# Patient Record
Sex: Male | Born: 1935 | Race: White | Hispanic: No | Smoking: Former smoker
Health system: Southern US, Community
[De-identification: ages and names within clinical notes are randomized; demographics above are authoritative.]

## PROBLEM LIST (undated history)

## (undated) DIAGNOSIS — N400 Enlarged prostate without lower urinary tract symptoms: Secondary | ICD-10-CM

## (undated) DIAGNOSIS — E785 Hyperlipidemia, unspecified: Secondary | ICD-10-CM

## (undated) DIAGNOSIS — I10 Essential (primary) hypertension: Secondary | ICD-10-CM

## (undated) HISTORY — PX: TIBIA FRACTURE SURGERY: SHX806

## (undated) HISTORY — DX: Benign prostatic hyperplasia without lower urinary tract symptoms: N40.0

## (undated) HISTORY — DX: Essential (primary) hypertension: I10

## (undated) HISTORY — PX: NO PAST SURGERIES: SHX2092

## (undated) HISTORY — DX: Hyperlipidemia, unspecified: E78.5

---

## 2005-01-02 ENCOUNTER — Ambulatory Visit: Payer: Self-pay | Admitting: Internal Medicine

## 2005-01-31 ENCOUNTER — Ambulatory Visit: Payer: Self-pay | Admitting: Internal Medicine

## 2005-05-28 ENCOUNTER — Ambulatory Visit: Payer: Self-pay | Admitting: Internal Medicine

## 2005-07-12 ENCOUNTER — Ambulatory Visit: Payer: Self-pay | Admitting: Internal Medicine

## 2005-07-16 ENCOUNTER — Ambulatory Visit: Payer: Self-pay | Admitting: Internal Medicine

## 2005-07-30 ENCOUNTER — Ambulatory Visit: Payer: Self-pay | Admitting: Internal Medicine

## 2005-08-02 ENCOUNTER — Ambulatory Visit: Payer: Self-pay | Admitting: Internal Medicine

## 2005-10-11 ENCOUNTER — Ambulatory Visit: Payer: Self-pay | Admitting: Internal Medicine

## 2005-10-17 ENCOUNTER — Ambulatory Visit: Payer: Self-pay | Admitting: Internal Medicine

## 2005-10-30 ENCOUNTER — Ambulatory Visit: Payer: Self-pay | Admitting: Internal Medicine

## 2005-12-18 ENCOUNTER — Ambulatory Visit: Payer: Self-pay | Admitting: Internal Medicine

## 2005-12-25 ENCOUNTER — Ambulatory Visit: Payer: Self-pay | Admitting: Internal Medicine

## 2006-01-21 ENCOUNTER — Ambulatory Visit: Payer: Self-pay | Admitting: Internal Medicine

## 2006-02-26 ENCOUNTER — Ambulatory Visit: Payer: Self-pay | Admitting: Internal Medicine

## 2006-05-26 ENCOUNTER — Ambulatory Visit: Payer: Self-pay | Admitting: Internal Medicine

## 2006-06-30 ENCOUNTER — Ambulatory Visit: Payer: Self-pay | Admitting: Internal Medicine

## 2006-07-01 ENCOUNTER — Encounter: Admission: RE | Admit: 2006-07-01 | Discharge: 2006-07-01 | Payer: Self-pay | Admitting: Internal Medicine

## 2006-08-06 ENCOUNTER — Ambulatory Visit: Payer: Self-pay | Admitting: Internal Medicine

## 2006-09-01 ENCOUNTER — Ambulatory Visit: Payer: Self-pay | Admitting: Internal Medicine

## 2006-12-02 ENCOUNTER — Ambulatory Visit: Payer: Self-pay | Admitting: Internal Medicine

## 2006-12-31 ENCOUNTER — Encounter: Admission: RE | Admit: 2006-12-31 | Discharge: 2006-12-31 | Payer: Self-pay | Admitting: Internal Medicine

## 2006-12-31 ENCOUNTER — Ambulatory Visit: Payer: Self-pay | Admitting: Internal Medicine

## 2006-12-31 ENCOUNTER — Encounter: Payer: Self-pay | Admitting: Cardiology

## 2007-01-14 ENCOUNTER — Ambulatory Visit: Payer: Self-pay

## 2007-01-14 ENCOUNTER — Encounter: Payer: Self-pay | Admitting: Internal Medicine

## 2007-02-11 DIAGNOSIS — E785 Hyperlipidemia, unspecified: Secondary | ICD-10-CM

## 2007-02-11 DIAGNOSIS — E119 Type 2 diabetes mellitus without complications: Secondary | ICD-10-CM | POA: Insufficient documentation

## 2007-02-11 DIAGNOSIS — I10 Essential (primary) hypertension: Secondary | ICD-10-CM | POA: Insufficient documentation

## 2007-02-12 ENCOUNTER — Encounter: Payer: Self-pay | Admitting: Internal Medicine

## 2007-02-12 ENCOUNTER — Ambulatory Visit: Payer: Self-pay | Admitting: Internal Medicine

## 2007-02-12 DIAGNOSIS — Z87898 Personal history of other specified conditions: Secondary | ICD-10-CM | POA: Insufficient documentation

## 2007-02-12 LAB — CONVERTED CEMR LAB
Basophils Absolute: 0 10*3/uL (ref 0.0–0.1)
Creatinine,U: 97.4 mg/dL
Eosinophils Absolute: 0.3 10*3/uL (ref 0.0–0.6)
Hemoglobin: 14 g/dL (ref 13.0–17.0)
Hgb A1c MFr Bld: 7.7 % — ABNORMAL HIGH (ref 4.6–6.0)
MCHC: 33.8 g/dL (ref 30.0–36.0)
MCV: 90.8 fL (ref 78.0–100.0)
Monocytes Absolute: 0.7 10*3/uL (ref 0.2–0.7)
Monocytes Relative: 7.5 % (ref 3.0–11.0)
PSA: 1.08 ng/mL (ref 0.10–4.00)
Potassium: 4.6 meq/L (ref 3.5–5.1)
RDW: 12.8 % (ref 11.5–14.6)

## 2007-03-05 ENCOUNTER — Encounter: Payer: Self-pay | Admitting: Internal Medicine

## 2007-03-26 ENCOUNTER — Ambulatory Visit: Payer: Self-pay | Admitting: Internal Medicine

## 2007-05-04 ENCOUNTER — Ambulatory Visit: Payer: Self-pay | Admitting: Family Medicine

## 2007-05-04 LAB — CONVERTED CEMR LAB
Bilirubin Urine: NEGATIVE
Blood in Urine, dipstick: NEGATIVE
Glucose, Urine, Semiquant: NEGATIVE
Ketones, urine, test strip: NEGATIVE
Nitrite: NEGATIVE
Specific Gravity, Urine: <1.005
pH: 7

## 2007-05-06 LAB — CONVERTED CEMR LAB
BUN: 15 mg/dL (ref 6–23)
Calcium: 9.7 mg/dL (ref 8.4–10.5)
Chloride: 101 meq/L (ref 96–112)
GFR calc Af Amer: 108 mL/min
GFR calc non Af Amer: 89 mL/min

## 2007-06-05 ENCOUNTER — Telehealth (INDEPENDENT_AMBULATORY_CARE_PROVIDER_SITE_OTHER): Payer: Self-pay | Admitting: *Deleted

## 2007-06-09 ENCOUNTER — Ambulatory Visit: Payer: Self-pay | Admitting: Internal Medicine

## 2007-06-12 LAB — CONVERTED CEMR LAB
ALT: 22 units/L (ref 0–53)
BUN: 16 mg/dL (ref 6–23)
Calcium: 9.4 mg/dL (ref 8.4–10.5)
Chloride: 107 meq/L (ref 96–112)
Creatinine, Ser: 1 mg/dL (ref 0.4–1.5)
GFR calc non Af Amer: 79 mL/min
Hgb A1c MFr Bld: 7.4 % — ABNORMAL HIGH (ref 4.6–6.0)
Sodium: 143 meq/L (ref 135–145)
TSH: 1.04 microintl units/mL (ref 0.35–5.50)

## 2007-06-18 ENCOUNTER — Encounter (INDEPENDENT_AMBULATORY_CARE_PROVIDER_SITE_OTHER): Payer: Self-pay | Admitting: *Deleted

## 2007-07-06 ENCOUNTER — Telehealth (INDEPENDENT_AMBULATORY_CARE_PROVIDER_SITE_OTHER): Payer: Self-pay | Admitting: *Deleted

## 2007-07-07 ENCOUNTER — Encounter: Payer: Self-pay | Admitting: Internal Medicine

## 2007-08-04 ENCOUNTER — Telehealth (INDEPENDENT_AMBULATORY_CARE_PROVIDER_SITE_OTHER): Payer: Self-pay | Admitting: *Deleted

## 2007-08-04 ENCOUNTER — Ambulatory Visit: Payer: Self-pay | Admitting: Internal Medicine

## 2007-08-05 ENCOUNTER — Telehealth: Payer: Self-pay | Admitting: Internal Medicine

## 2007-08-07 ENCOUNTER — Telehealth (INDEPENDENT_AMBULATORY_CARE_PROVIDER_SITE_OTHER): Payer: Self-pay | Admitting: *Deleted

## 2007-09-09 ENCOUNTER — Ambulatory Visit: Payer: Self-pay | Admitting: Internal Medicine

## 2007-12-01 ENCOUNTER — Ambulatory Visit: Payer: Self-pay | Admitting: Internal Medicine

## 2007-12-25 ENCOUNTER — Ambulatory Visit: Payer: Self-pay | Admitting: Internal Medicine

## 2007-12-25 DIAGNOSIS — J309 Allergic rhinitis, unspecified: Secondary | ICD-10-CM | POA: Insufficient documentation

## 2007-12-31 ENCOUNTER — Ambulatory Visit: Payer: Self-pay | Admitting: Internal Medicine

## 2007-12-31 LAB — CONVERTED CEMR LAB
Blood in Urine, dipstick: NEGATIVE
Glucose, Urine, Semiquant: NEGATIVE
Nitrite: NEGATIVE
PSA: 1.91 ng/mL (ref 0.10–4.00)
Protein, U semiquant: NEGATIVE
Specific Gravity, Urine: 1.01
WBC Urine, dipstick: NEGATIVE

## 2008-01-01 ENCOUNTER — Encounter (INDEPENDENT_AMBULATORY_CARE_PROVIDER_SITE_OTHER): Payer: Self-pay | Admitting: *Deleted

## 2008-01-01 LAB — CONVERTED CEMR LAB
BUN: 13 mg/dL (ref 6–23)
Creatinine, Ser: 0.8 mg/dL (ref 0.4–1.5)
GFR calc Af Amer: 123 mL/min
Hgb A1c MFr Bld: 7.7 % — ABNORMAL HIGH (ref 4.6–6.0)
Potassium: 4.1 meq/L (ref 3.5–5.1)
Sodium: 134 meq/L — ABNORMAL LOW (ref 135–145)

## 2008-01-19 ENCOUNTER — Encounter: Payer: Self-pay | Admitting: Internal Medicine

## 2008-01-25 ENCOUNTER — Encounter: Admission: RE | Admit: 2008-01-25 | Discharge: 2008-01-25 | Payer: Self-pay | Admitting: Internal Medicine

## 2008-02-02 ENCOUNTER — Encounter: Payer: Self-pay | Admitting: Internal Medicine

## 2008-02-06 DIAGNOSIS — E669 Obesity, unspecified: Secondary | ICD-10-CM | POA: Insufficient documentation

## 2008-02-08 ENCOUNTER — Telehealth (INDEPENDENT_AMBULATORY_CARE_PROVIDER_SITE_OTHER): Payer: Self-pay | Admitting: *Deleted

## 2008-02-10 ENCOUNTER — Telehealth (INDEPENDENT_AMBULATORY_CARE_PROVIDER_SITE_OTHER): Payer: Self-pay | Admitting: *Deleted

## 2008-02-10 ENCOUNTER — Encounter: Payer: Self-pay | Admitting: Internal Medicine

## 2008-03-01 ENCOUNTER — Ambulatory Visit: Payer: Self-pay | Admitting: Internal Medicine

## 2008-03-02 ENCOUNTER — Encounter (INDEPENDENT_AMBULATORY_CARE_PROVIDER_SITE_OTHER): Payer: Self-pay | Admitting: *Deleted

## 2008-03-17 ENCOUNTER — Encounter: Payer: Self-pay | Admitting: Internal Medicine

## 2008-04-06 ENCOUNTER — Encounter: Payer: Self-pay | Admitting: Internal Medicine

## 2008-04-25 ENCOUNTER — Encounter: Payer: Self-pay | Admitting: Internal Medicine

## 2008-05-18 ENCOUNTER — Ambulatory Visit: Payer: Self-pay | Admitting: Internal Medicine

## 2008-05-23 ENCOUNTER — Encounter (INDEPENDENT_AMBULATORY_CARE_PROVIDER_SITE_OTHER): Payer: Self-pay | Admitting: *Deleted

## 2008-05-23 LAB — CONVERTED CEMR LAB
ALT: 21 units/L (ref 0–53)
AST: 19 units/L (ref 0–37)
HDL: 34.4 mg/dL — ABNORMAL LOW (ref >39.0)
Hgb A1c MFr Bld: 8.9 % — ABNORMAL HIGH (ref 4.6–6.0)

## 2008-05-24 ENCOUNTER — Ambulatory Visit: Payer: Self-pay | Admitting: Internal Medicine

## 2008-05-24 DIAGNOSIS — L538 Other specified erythematous conditions: Secondary | ICD-10-CM | POA: Insufficient documentation

## 2008-06-01 ENCOUNTER — Telehealth (INDEPENDENT_AMBULATORY_CARE_PROVIDER_SITE_OTHER): Payer: Self-pay | Admitting: *Deleted

## 2008-09-12 ENCOUNTER — Telehealth (INDEPENDENT_AMBULATORY_CARE_PROVIDER_SITE_OTHER): Payer: Self-pay | Admitting: *Deleted

## 2008-11-20 ENCOUNTER — Emergency Department (HOSPITAL_COMMUNITY): Admission: EM | Admit: 2008-11-20 | Discharge: 2008-11-20 | Payer: Self-pay | Admitting: Emergency Medicine

## 2008-11-28 ENCOUNTER — Ambulatory Visit: Payer: Self-pay | Admitting: Internal Medicine

## 2008-11-28 LAB — HM DIABETES FOOT EXAM

## 2008-12-01 LAB — CONVERTED CEMR LAB
AST: 17 units/L (ref 0–37)
CO2: 29 meq/L (ref 19–32)
Chloride: 104 meq/L (ref 96–112)
Cholesterol: 201 mg/dL (ref 0–200)
Creatinine, Ser: 0.7 mg/dL (ref 0.4–1.5)
Creatinine,U: 75.7 mg/dL
Glucose, Bld: 131 mg/dL — ABNORMAL HIGH (ref 70–99)
Hgb A1c MFr Bld: 6.5 % — ABNORMAL HIGH (ref 4.6–6.0)
Microalb, Ur: 1.5 mg/dL (ref 0.0–1.9)
Total CHOL/HDL Ratio: 6.5

## 2008-12-08 ENCOUNTER — Ambulatory Visit: Payer: Self-pay | Admitting: Internal Medicine

## 2009-02-14 ENCOUNTER — Ambulatory Visit: Payer: Self-pay | Admitting: Internal Medicine

## 2009-02-21 ENCOUNTER — Encounter: Payer: Self-pay | Admitting: Internal Medicine

## 2009-03-01 ENCOUNTER — Telehealth (INDEPENDENT_AMBULATORY_CARE_PROVIDER_SITE_OTHER): Payer: Self-pay | Admitting: *Deleted

## 2009-05-05 ENCOUNTER — Ambulatory Visit: Payer: Self-pay | Admitting: Internal Medicine

## 2009-05-05 ENCOUNTER — Encounter: Payer: Self-pay | Admitting: Cardiology

## 2009-05-08 ENCOUNTER — Encounter (INDEPENDENT_AMBULATORY_CARE_PROVIDER_SITE_OTHER): Payer: Self-pay | Admitting: *Deleted

## 2009-05-11 ENCOUNTER — Ambulatory Visit: Payer: Self-pay | Admitting: Internal Medicine

## 2009-05-25 ENCOUNTER — Ambulatory Visit: Payer: Self-pay | Admitting: Internal Medicine

## 2009-05-29 LAB — CONVERTED CEMR LAB
ALT: 24 units/L (ref 0–53)
Basophils Absolute: 0.1 10*3/uL (ref 0.0–0.1)
Eosinophils Absolute: 0.5 10*3/uL (ref 0.0–0.7)
HCT: 39.9 % (ref 39.0–52.0)
Hemoglobin: 14 g/dL (ref 13.0–17.0)
Lymphs Abs: 4.3 10*3/uL — ABNORMAL HIGH (ref 0.7–4.0)
MCHC: 35.1 g/dL (ref 30.0–36.0)
Monocytes Absolute: 0.9 10*3/uL (ref 0.1–1.0)
Neutro Abs: 5.7 10*3/uL (ref 1.4–7.7)
RDW: 13.4 % (ref 11.5–14.6)

## 2009-06-01 ENCOUNTER — Encounter (INDEPENDENT_AMBULATORY_CARE_PROVIDER_SITE_OTHER): Payer: Self-pay | Admitting: *Deleted

## 2009-06-06 ENCOUNTER — Ambulatory Visit: Payer: Self-pay | Admitting: Cardiovascular Disease

## 2009-06-23 ENCOUNTER — Encounter: Payer: Self-pay | Admitting: Internal Medicine

## 2009-07-25 ENCOUNTER — Ambulatory Visit: Payer: Self-pay | Admitting: Internal Medicine

## 2009-07-29 ENCOUNTER — Telehealth (INDEPENDENT_AMBULATORY_CARE_PROVIDER_SITE_OTHER): Payer: Self-pay | Admitting: *Deleted

## 2009-07-31 ENCOUNTER — Encounter (INDEPENDENT_AMBULATORY_CARE_PROVIDER_SITE_OTHER): Payer: Self-pay | Admitting: *Deleted

## 2009-08-01 LAB — CONVERTED CEMR LAB
CO2: 29 meq/L (ref 19–32)
Calcium: 9.3 mg/dL (ref 8.4–10.5)
Chloride: 102 meq/L (ref 96–112)
HDL: 35.3 mg/dL — ABNORMAL LOW (ref >39.00)
Hgb A1c MFr Bld: 7.6 % — ABNORMAL HIGH (ref 4.6–6.5)
LDL Cholesterol: 82 mg/dL (ref 0–99)
PSA: 2.86 ng/mL (ref 0.10–4.00)
Sodium: 140 meq/L (ref 135–145)
Total CHOL/HDL Ratio: 4
Triglycerides: 180 mg/dL — ABNORMAL HIGH (ref 0.0–149.0)

## 2009-08-07 ENCOUNTER — Telehealth (INDEPENDENT_AMBULATORY_CARE_PROVIDER_SITE_OTHER): Payer: Self-pay | Admitting: *Deleted

## 2009-08-14 ENCOUNTER — Ambulatory Visit: Payer: Self-pay | Admitting: Internal Medicine

## 2009-08-21 ENCOUNTER — Encounter (INDEPENDENT_AMBULATORY_CARE_PROVIDER_SITE_OTHER): Payer: Self-pay | Admitting: *Deleted

## 2009-08-22 ENCOUNTER — Encounter (INDEPENDENT_AMBULATORY_CARE_PROVIDER_SITE_OTHER): Payer: Self-pay | Admitting: *Deleted

## 2009-08-22 ENCOUNTER — Ambulatory Visit: Payer: Self-pay | Admitting: Internal Medicine

## 2009-09-05 ENCOUNTER — Ambulatory Visit: Payer: Self-pay | Admitting: Internal Medicine

## 2009-09-11 ENCOUNTER — Encounter: Payer: Self-pay | Admitting: Internal Medicine

## 2009-11-21 ENCOUNTER — Ambulatory Visit: Payer: Self-pay | Admitting: Internal Medicine

## 2009-11-21 LAB — CONVERTED CEMR LAB
Blood in Urine, dipstick: NEGATIVE
Ketones, urine, test strip: NEGATIVE
Nitrite: NEGATIVE

## 2009-11-22 LAB — CONVERTED CEMR LAB
BUN: 12 mg/dL (ref 6–23)
CO2: 31 meq/L (ref 19–32)
Calcium: 9.3 mg/dL (ref 8.4–10.5)
Chloride: 107 meq/L (ref 96–112)
Creatinine, Ser: 0.8 mg/dL (ref 0.4–1.5)
GFR calc non Af Amer: 101.21 mL/min (ref >60)
Glucose, Bld: 128 mg/dL — ABNORMAL HIGH (ref 70–99)
Hgb A1c MFr Bld: 7.6 % — ABNORMAL HIGH (ref 4.6–6.5)
PSA: 10.99 ng/mL — ABNORMAL HIGH (ref 0.10–4.00)
Potassium: 5.1 meq/L (ref 3.5–5.1)
Sodium: 140 meq/L (ref 135–145)

## 2009-12-08 ENCOUNTER — Telehealth: Payer: Self-pay | Admitting: Internal Medicine

## 2009-12-27 ENCOUNTER — Ambulatory Visit: Payer: Self-pay | Admitting: Internal Medicine

## 2010-01-02 ENCOUNTER — Telehealth: Payer: Self-pay | Admitting: Internal Medicine

## 2010-02-05 ENCOUNTER — Ambulatory Visit: Payer: Self-pay | Admitting: Internal Medicine

## 2010-02-05 ENCOUNTER — Telehealth (INDEPENDENT_AMBULATORY_CARE_PROVIDER_SITE_OTHER): Payer: Self-pay | Admitting: *Deleted

## 2010-02-05 DIAGNOSIS — R109 Unspecified abdominal pain: Secondary | ICD-10-CM

## 2010-02-05 LAB — CONVERTED CEMR LAB
Bilirubin Urine: NEGATIVE
Glucose, Urine, Semiquant: NEGATIVE
Ketones, urine, test strip: NEGATIVE
Urobilinogen, UA: 0.2
pH: 6

## 2010-02-09 LAB — CONVERTED CEMR LAB: PSA: 2.66 ng/mL (ref 0.10–4.00)

## 2010-03-09 ENCOUNTER — Ambulatory Visit: Payer: Self-pay | Admitting: Internal Medicine

## 2010-03-15 ENCOUNTER — Ambulatory Visit: Payer: Self-pay | Admitting: Internal Medicine

## 2010-03-19 LAB — CONVERTED CEMR LAB
CO2: 28 meq/L (ref 19–32)
Cholesterol: 223 mg/dL — ABNORMAL HIGH (ref 0–200)
Glucose, Bld: 127 mg/dL — ABNORMAL HIGH (ref 70–99)
HDL: 39.5 mg/dL (ref >39.00)
Potassium: 4.5 meq/L (ref 3.5–5.1)
Sodium: 140 meq/L (ref 135–145)
Triglycerides: 134 mg/dL (ref 0.0–149.0)

## 2010-07-26 IMAGING — CR DG HIP COMPLETE 2+V*R*
3 series · 3 of 3 positions shown · non-contrast
Comparison: None

CLINICAL DATA: Right hip pain

RIGHT HIP - COMPLETE 2+ VIEW

[view not recorded (1 of 3)]
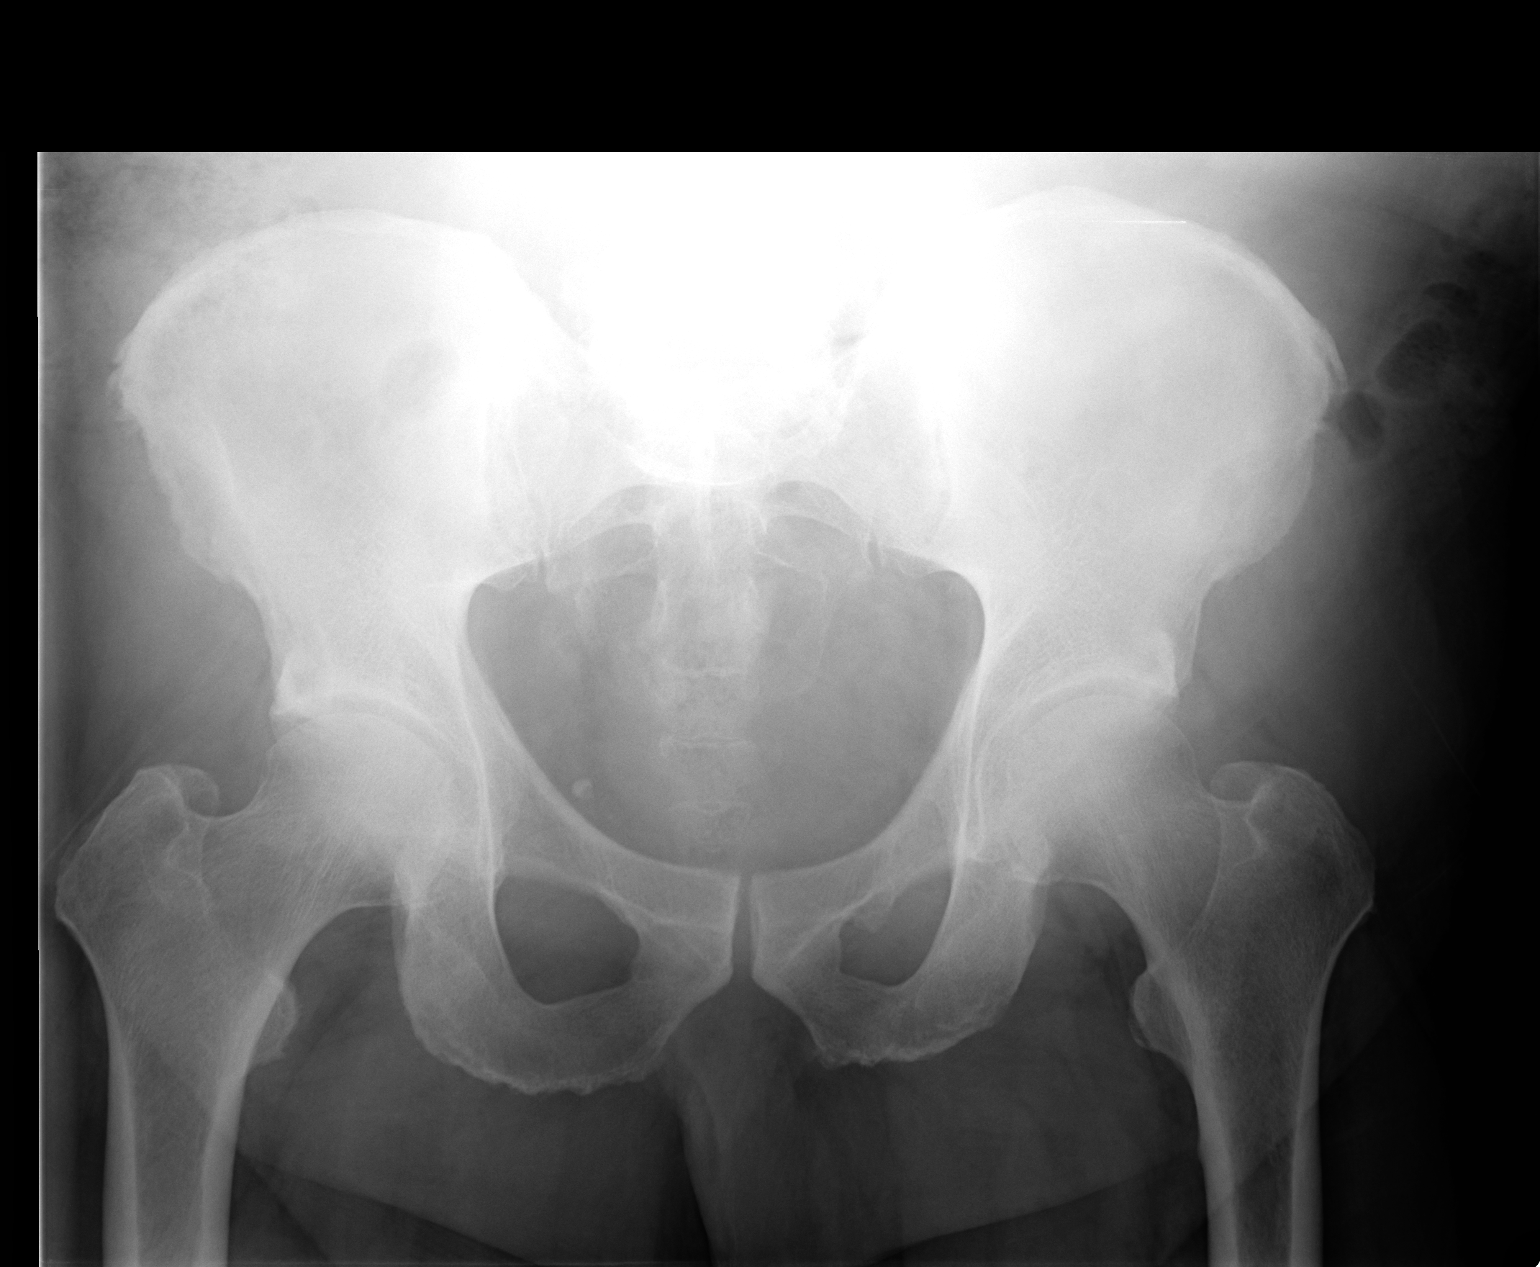

[view not recorded (2 of 3)]
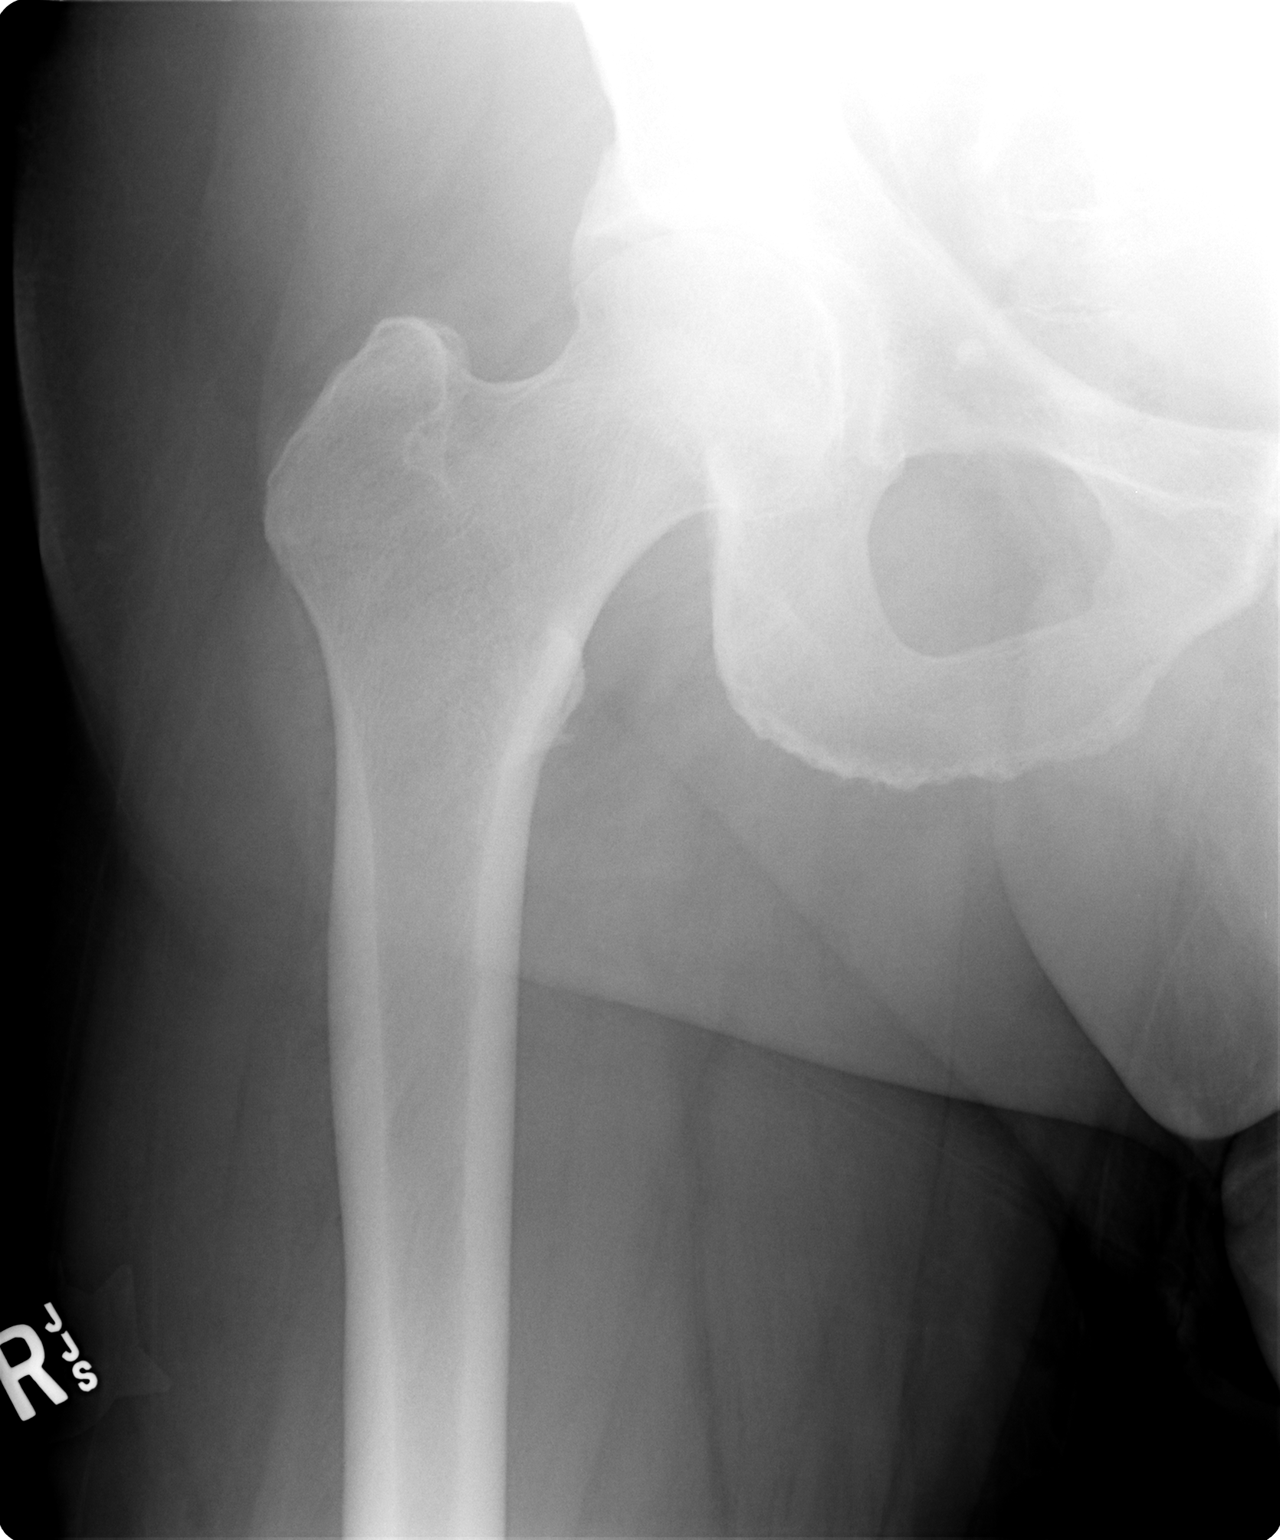

[view not recorded (3 of 3)]
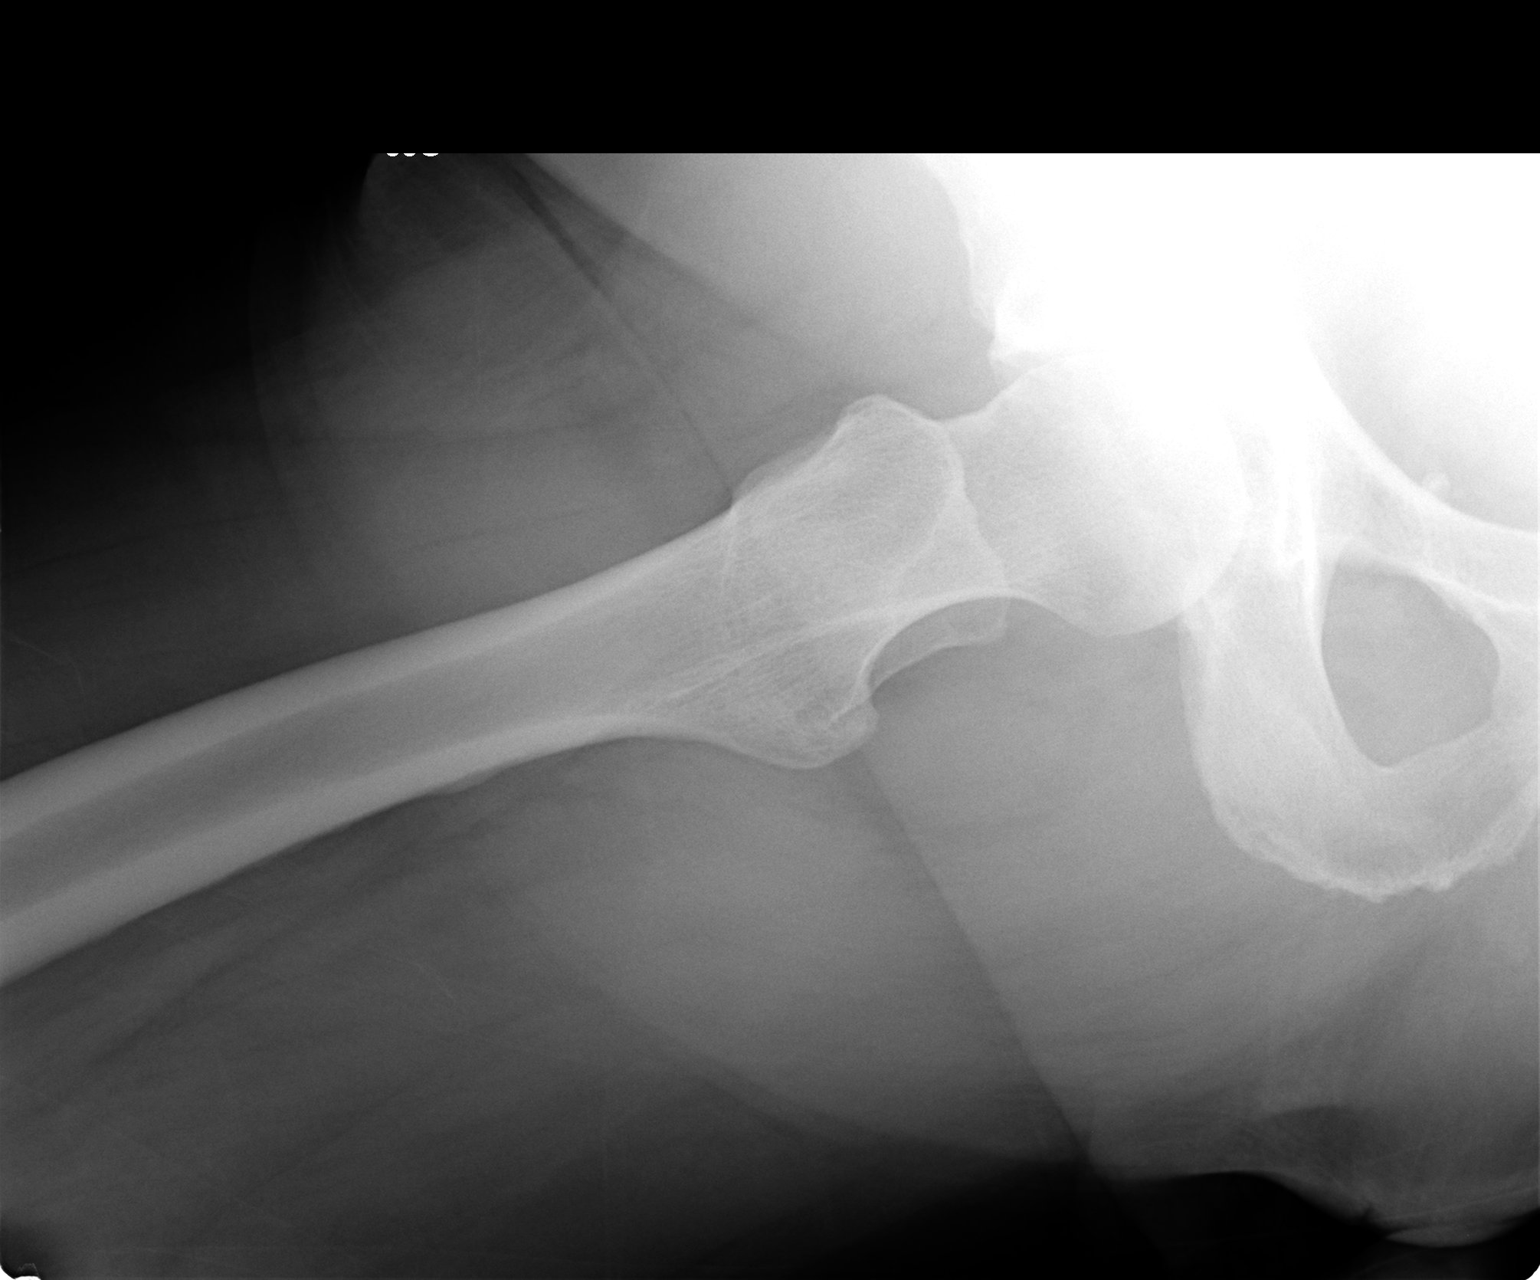

[3 of 3 positions shown; findings below may reference images not displayed]

FINDINGS: Joint space height is normal bilaterally.  No evidence by
osteophyte formation or sclerosis.  No fracture.  No other focal
lesion.
IMPRESSION: Negative radiographs

## 2010-10-18 ENCOUNTER — Telehealth (INDEPENDENT_AMBULATORY_CARE_PROVIDER_SITE_OTHER): Payer: Self-pay | Admitting: *Deleted

## 2010-10-19 ENCOUNTER — Other Ambulatory Visit: Payer: Self-pay | Admitting: Internal Medicine

## 2010-10-19 ENCOUNTER — Ambulatory Visit
Admission: RE | Admit: 2010-10-19 | Discharge: 2010-10-19 | Payer: Self-pay | Source: Home / Self Care | Attending: Internal Medicine | Admitting: Internal Medicine

## 2010-10-19 LAB — BASIC METABOLIC PANEL
BUN: 12 mg/dL (ref 6–23)
CO2: 28 mEq/L (ref 19–32)
Calcium: 9.3 mg/dL (ref 8.4–10.5)
Chloride: 104 mEq/L (ref 96–112)
Creatinine, Ser: 0.8 mg/dL (ref 0.4–1.5)
GFR: 98.11 mL/min (ref >60.00)
Glucose, Bld: 117 mg/dL — ABNORMAL HIGH (ref 70–99)
Potassium: 4.4 mEq/L (ref 3.5–5.1)
Sodium: 139 mEq/L (ref 135–145)

## 2010-10-19 LAB — CONVERTED CEMR LAB
Bilirubin Urine: NEGATIVE
Blood in Urine, dipstick: NEGATIVE
Glucose, Urine, Semiquant: NEGATIVE
Ketones, urine, test strip: NEGATIVE
Nitrite: NEGATIVE
Protein, U semiquant: NEGATIVE
Specific Gravity, Urine: <1.005
Urobilinogen, UA: NEGATIVE
WBC Urine, dipstick: NEGATIVE
pH: 6.5

## 2010-10-19 LAB — CBC WITH DIFFERENTIAL/PLATELET
Basophils Absolute: 0.1 10*3/uL (ref 0.0–0.1)
Basophils Relative: 0.6 % (ref 0.0–3.0)
Eosinophils Absolute: 0.4 10*3/uL (ref 0.0–0.7)
Eosinophils Relative: 3.5 % (ref 0.0–5.0)
HCT: 41 % (ref 39.0–52.0)
Hemoglobin: 13.7 g/dL (ref 13.0–17.0)
Lymphocytes Relative: 40.5 % (ref 12.0–46.0)
Lymphs Abs: 4.6 10*3/uL — ABNORMAL HIGH (ref 0.7–4.0)
MCHC: 33.5 g/dL (ref 30.0–36.0)
MCV: 93.2 fl (ref 78.0–100.0)
Monocytes Absolute: 0.7 10*3/uL (ref 0.1–1.0)
Monocytes Relative: 6.6 % (ref 3.0–12.0)
Neutro Abs: 5.5 10*3/uL (ref 1.4–7.7)
Neutrophils Relative %: 48.8 % (ref 43.0–77.0)
Platelets: 272 10*3/uL (ref 150.0–400.0)
RBC: 4.39 Mil/uL (ref 4.22–5.81)
RDW: 13.6 % (ref 11.5–14.6)
WBC: 11.3 10*3/uL — ABNORMAL HIGH (ref 4.5–10.5)

## 2010-10-19 LAB — HEMOGLOBIN A1C: Hgb A1c MFr Bld: 7.1 % — ABNORMAL HIGH (ref 4.6–6.5)

## 2010-10-19 LAB — TSH: TSH: 1.21 u[IU]/mL (ref 0.35–5.50)

## 2010-11-06 NOTE — Assessment & Plan Note (Signed)
Summary: feet & gas--acute only--tl   Vital Signs:  Patient Profile:   75 Years Old Male Height:     66 inches Weight:      216.2 pounds Temp:     99.7 degrees F oral BP sitting:   122 / 80  Vitals Entered By: Shary Decamp (December 25, 2007 11:27 AM)             Comments -having to clear throat a lot +cough -rt foot pain - patient has been dx with plantar facitis     PCP:  Laury Axon  Chief Complaint:  Cough.  History of Present Illness: several concerns -having to clear throat a lot , some cough -rt foot pain - patient has been dx with plantar facitis in [place] -review labs from [place] -likes a referal to nutritionist -likes a new glucometer    Prior Medications Reviewed Using: Patient Recall  Updated Prior Medication List: MICARDIS 40 MG  TABS (TELMISARTAN) 1 once daily JANUVIA 100 MG TABS (SITAGLIPTIN PHOSPHATE) 1 by mouth once daily LIPITOR 10 MG  TABS (ATORVASTATIN CALCIUM) 1 by mouth at bedtime  Current Allergies (reviewed today): ! ACTOS (PIOGLITAZONE HCL) * ACE INHIBITORS GROUP GLIPIZIDE (GLIPIZIDE) GLUCOPHAGE (METFORMIN HCL)  Past Medical History:    Reviewed history from 12/01/2007 and no changes required:       DIABETES MELLITUS, TYPE II (ICD-250.00)       HYPERLIPIDEMIA (ICD-272.4)       HYPERTENSION (ICD-401.9)       BENIGN PROSTATIC HYPERTROPHY, MILD, HX OF (ICD-V13.8)       Allergic rhinitis     Review of Systems  General      Denies chills and fever.  ENT      Complains of postnasal drainage.      + sinus congestion in AM nasal d/c yellow some ST   Physical Exam  General:     alert and well-developed.   Head:     normocephalic and atraumatic.  sinus no tender Ears:     R ear normal and L ear normal.   Nose:     congested Lungs:     normal respiratory effort, no intercostal retractions, no accessory muscle use, and normal breath sounds.   Heart:     normal rate, regular rhythm, and no murmur.      Impression &  Recommendations:  Problem # 1:  DIABETES MELLITUS, TYPE II (ICD-250.00) new glucometer and instructions provided will arrenge nutritionist [place] 10-2007: TSH 2.2, U acid 5.2 (nl), Tchol 210, TG  185, LFTs nl, RF nl, Hg 13.4  His updated medication list for this problem includes:    Micardis 40 Mg Tabs (Telmisartan) .Marland Kitchen... 1 once daily    Januvia 100 Mg Tabs (Sitagliptin phosphate) .Marland Kitchen... 1 by mouth once daily  Labs Reviewed: HgBA1c: 7.4 (06/09/2007)   Creat: 1.0 (06/09/2007)     Orders: TLB-BMP (Basic Metabolic Panel-BMET) (80048-METABOL) TLB-A1C / Hgb A1C (Glycohemoglobin) (83036-A1C) Venipuncture ([address]) Nutrition Referral (Nutrition)   Problem # 2:  FOOT PAIN, RIGHT (ICD-729.5) note from ortho ([place] ) reviewed Dx w/ Plantar Fasceitis agree stretching discussed  Problem # 3:  ALLERGIC RHINITIS (ICD-477.9) Assessment: New sx c/w AR Flonase His updated medication list for this problem includes:    Flonase 50 Mcg/act Susp (Fluticasone propionate) .Marland Kitchen... 2 puff once daily on each side of the nose   Problem # 4:  HYPERLIPIDEMIA (ICD-272.4) TC 210 per labs done  10-2007 will RTC fasting His  updated medication list for this problem includes:    Lipitor 10 Mg Tabs (Atorvastatin calcium) .Marland Kitchen... 1 by mouth at bedtime  Labs Reviewed: SGOT: 19 (06/09/2007)   SGPT: 22 (06/09/2007)   Problem # 5:  multiple questions answer, previous labs reviewed F2F 30 min aprox  Complete Medication List: 1)  Micardis 40 Mg Tabs (Telmisartan) .Marland Kitchen.. 1 once daily 2)  Januvia 100 Mg Tabs (Sitagliptin phosphate) .Marland Kitchen.. 1 by mouth once daily 3)  Lipitor 10 Mg Tabs (Atorvastatin calcium) .Marland Kitchen.. 1 by mouth at bedtime 4)  Freestyle Freedom Lite Test Strips  .... Check blood sugar three times a day dx 250.00 5)  Freestyle Freedom Lite Lancets  .... Check blood sugar three times a day dx 250.00 6)  Flonase 50 Mcg/act Susp (Fluticasone propionate) .... 2 puff once  daily on each side of the nose     Prescriptions: FLONASE 50 MCG/ACT  SUSP (FLUTICASONE PROPIONATE) 2 puff once daily on each side of the nose  #1 x 6   Entered and Authorized by:   Elita Quick E. Treyvin Glidden MD   Signed by:   Nolon Rod. Rabia Argote MD on 12/25/2007   Method used:   Print then Give to Patient   RxID:   [id number] FREESTYLE FREEDOM LITE LANCETS check blood sugar three times a day dx 250.00  #1 mo supply x 11   Entered by:   Shary Decamp   Authorized by:   Nolon Rod. Brandilyn Nanninga MD   Signed by:   Shary Decamp on 12/25/2007   Method used:   Print then Give to Patient   RxID:   [id number] FREESTYLE FREEDOM LITE TEST STRIPS check blood sugar three times a day dx 250.00  #1 mo supply x 11   Entered by:   Shary Decamp   Authorized by:   Nolon Rod. Laquan Ludden MD   Signed by:   Shary Decamp on 12/25/2007   Method used:   Print then Give to Patient   RxID:   [id number]  ]

## 2010-11-06 NOTE — Assessment & Plan Note (Signed)
Summary: rto 1 month/cbs   Vital Signs:  Patient profile:   75 year old male Height:      66 inches Weight:      222 pounds Temp:     98.5 degrees F oral Pulse rate:   80 / minute BP sitting:   140 / 76  (left arm)  Vitals Entered By: Jeremy Johann CMA (March 09, 2010 1:19 PM) CC: f/u pain in groin area Comments --med lower BS --rash under both arms and groin area --labs, not faisting REVIEWED MED LIST, PATIENT AGREED DOSE AND INSTRUCTION CORRECT    History of Present Illness: multiple issues  DIABETES --  on actos "sometimes" ( every other day) b/c shortly after taking actos he feels  "awkward" taking a natural medicine  for sugar ("alpiste")  continue w/  pain at R groin, never had the x-ray done  rash in the armpits and groin x > 40 years , would like a referal   HYPERLIPIDEMIA-- on lipitor "sometimes "  (every other day )     Allergies: 1)  ! Lipitor 2)  * Ace Inhibitors Group 3)  Glipizide (Glipizide) 4)  Glucophage (Metformin Hcl)  Past History:  Past Medical History: DIABETES MELLITUS, TYPE II  HYPERLIPIDEMIA (ICD-272.4) HYPERTENSION (ICD-401.9) BENIGN PROSTATIC HYPERTROPHY, MILD, HX OF (ICD-V13.8) Allergic rhinitis    Past Surgical History: Reviewed history from 05/18/2008 and no changes required. Denies surgical history  Social History: Reviewed history from 06/06/2009 and no changes required. from [place] Single, divorced 5 children Remote tobacco-none since 1984 (Smoked 2ppd for 20 years) No alcohol No illicit drugs Aerial surveyor-semi retired  Review of Systems CV:  Denies chest pain or discomfort and swelling of feet. Resp:  Denies cough and shortness of breath. GI:  Denies bloody stools, diarrhea, nausea, and vomiting. GU:  no testicular pain or swelling.  Physical Exam  General:  alert and well-developed.   Lungs:  normal respiratory effort, no intercostal retractions, no accessory muscle use, and normal breath sounds.     Heart:  normal rate, regular rhythm, no murmur, and no gallop.   Abdomen:  soft, non-tender, no distention, and no inguinal hernia.   Genitalia:  no scrotal masses, no testicular masses or atrophy, and no cutaneous lesions.   No inguinal lymphadenopathy Msk:  rotation of the hips showed no discomfort   Impression & Recommendations:  Problem # 1:  INGUINAL PAIN, RIGHT (ICD-789.09) etiology unclear, no evidence of hernia, no testicular pain and testicular exam normal Plan: X-ray of the right hip  Orders: T-Hip Comp Right Min 2 views (73510TC)  Problem # 2:  HYPERLIPIDEMIA (ICD-272.4) poor compliance (a chronic issue)  takes Lipitor every other day  Labs His updated medication list for this problem includes:    Lipitor 20 Mg Tabs (Atorvastatin calcium) ..... One a day  Labs Reviewed: SGOT: 24 (07/25/2009)   SGPT: 28 (07/25/2009)   HDL:35.30 (07/25/2009), 33.10 (05/25/2009)  LDL:82 (07/25/2009), DEL (38/75/6433)  Chol:153 (07/25/2009), 201 (05/25/2009)  Trig:180.0 (07/25/2009), 171.0 (05/25/2009)  Problem # 3:  HYPERTENSION (ICD-401.9) no change His updated medication list for this problem includes:    Losartan Potassium 100 Mg Tabs (Losartan potassium) .Marland Kitchen... 1 by mouth once daily  BP today: 140/76 Prior BP: 124/82 (02/05/2010)  Labs Reviewed: K+: 5.1 (11/21/2009) Creat: : 0.8 (11/21/2009)   Chol: 153 (07/25/2009)   HDL: 35.30 (07/25/2009)   LDL: 82 (07/25/2009)   TG: 180.0 (07/25/2009)  Problem # 4:  DIABETES MELLITUS, TYPE II (ICD-250.00) poor compliance  with Actos, takes it every other day, has a number of ill-defined side effects from it.  Noncompliance  is a ongoing issue His updated medication list for this problem includes:    Losartan Potassium 100 Mg Tabs (Losartan potassium) .Marland Kitchen... 1 by mouth once daily    Januvia 100 Mg Tabs (Sitagliptin phosphate) .Marland Kitchen... 1 by mouth once daily    Glimepiride 2 Mg Tabs (Glimepiride) .Marland Kitchen... 1.5 tabs once daily    Actos 30 Mg Tabs  (Pioglitazone hcl) ..... One daily  Labs Reviewed: Creat: 0.8 (11/21/2009)    Reviewed HgBA1c results: 7.6 (11/21/2009)  7.6 (07/25/2009)  Problem # 5:  INTERTRIGO (ICD-695.89)  see history of the present illness, refer to dermatology  Orders: Dermatology Referral (Derma)  Complete Medication List: 1)  Losartan Potassium 100 Mg Tabs (Losartan potassium) .Marland Kitchen.. 1 by mouth once daily 2)  Januvia 100 Mg Tabs (Sitagliptin phosphate) .Marland Kitchen.. 1 by mouth once daily 3)  Glimepiride 2 Mg Tabs (Glimepiride) .... 1.5 tabs once daily 4)  Actos 30 Mg Tabs (Pioglitazone hcl) .... One daily 5)  Lipitor 20 Mg Tabs (Atorvastatin calcium) .... One a day 6)  Flomax 0.4 Mg Cp24 (Tamsulosin hcl) .Marland Kitchen.. 1 by mouth once daily 7)  Freestyle Freedom Lite Test Strips  .... Check blood sugar three times a day dx 250.00 8)  Freestyle Freedom Lite Lancets  .... Check blood sugar three times a day dx 250.00 9)  Nitrostat 0.4 Mg Subl (Nitroglycerin) .Marland Kitchen.. 1 sl q 5 min as needed for  chest pain  Patient Instructions: 1)  come back fasting 2)  FLP ---- dx hyperlipidemia 3)  hemoglobin A1c, microalb. --- dx dm 4)  BMP----BX hypertension 5)  Please schedule a follow-up appointment in 4 months .

## 2010-11-06 NOTE — Progress Notes (Signed)
Summary: Prior Auth  Phone Note Refill Request Call back at [phone] Message from:  Pharmacy on Feb 05, 2010 9:30 AM  Refills Requested: Medication #1:  LOSARTAN POTASSIUM 100 MG TABS 1 by mouth once daily   Dosage confirmed as above?Dosage Confirmed   Supply Requested: 1 month Prior Auth [id number]  Next Appointment Scheduled: 5.2.11 Initial call taken by: Harold Barban,  Feb 05, 2010 9:31 AM  Follow-up for Phone Call        NO PRIOR AUTH IS REQUIRED AND PT HAS FILLED RX PER [facility] .Kandice Hams  Feb 05, 2010 3:42 PM

## 2010-11-06 NOTE — Assessment & Plan Note (Signed)
Summary: 4 MONTH FOLLOWUP///SPH   Vital Signs:  Patient profile:   75 year old male Height:      66 inches Weight:      223.4 pounds Pulse rate:   62 / minute BP sitting:   140 / 82  Vitals Entered By: rachel peeler CC: f/u Comments wants to disucss flomax and prostate   History of Present Illness: BPH symptoms increased for the last two months: Moderate to severe urgency during the daytime, nocturia for two or 3 times, some frequency. Symptoms are despite Flomax and saw palmetto  diabetes: He was   recommended to take two glimepiride, he couldn't tolerate 2 tabs  so is currently taking 1.5 tablets daily amb. CBGs around 130 although they are  rarely checked  hypertension: ambulatory BPs around 130/80.  Good medication compliance  Allergies: 1)  ! Actos (Pioglitazone Hcl) 2)  * Ace Inhibitors Group 3)  Glipizide (Glipizide) 4)  Glucophage (Metformin Hcl)  Past History:  Past Medical History: DIABETES MELLITUS, TYPE II  HYPERLIPIDEMIA (ICD-272.4) HYPERTENSION (ICD-401.9) BENIGN PROSTATIC HYPERTROPHY, MILD, HX OF (ICD-V13.8) Allergic rhinitis    Past Surgical History: Reviewed history from 05/18/2008 and no changes required. Denies surgical history  Social History: Reviewed history from 06/06/2009 and no changes required. from [place] Single, divorced 5 children Remote tobacco-none since 1984 (Smoked 2ppd for 20 years) No alcohol No illicit drugs Aerial surveyor-semi retired  Review of Systems       admits to poor diet denies fever, dysuria, hematuria or difficulty urinating no chest pain, shortness of breath or lower extremity edema sometimes have right groin pain, worse with walking, no mass.  He held Lipitor for a couple of weeks and thinks that the pain got better but he is back on lipitor   Physical Exam  General:  alert, well-developed, and overweight-appearing.   Abdomen:  no inguinal hernia.   Rectal:  No external abnormalities noted. Normal  sphincter tone. No rectal masses or tenderness. Genitalia:  no hydrocele, no varicocele, no scrotal masses, no testicular masses or atrophy, no cutaneous lesions, and no urethral discharge.   Prostate:  Prostate gland firm and smooth, no enlargement, nodularity, tenderness, mass, asymmetry or induration.   Impression & Recommendations:  Problem # 1:  HYPERTROPHY PROSTATE W/O UR OBST & OTH LUTS (ICD-600.00) increased BPH symptoms, may be related to progression of the disease versus prostatitis DRE normal today  recheck  a PSA, it was 2.8 on October 2010 if the PSA is acutely elevated, he will get antibiotics  otherwise refer to urology  Orders: TLB-PSA (Prostate Specific Antigen) (84153-PSA)  Problem # 2:  HYPERTENSION (ICD-401.9) at goal  His updated medication list for this problem includes:    Micardis 80 Mg Tabs (Telmisartan) ..... One by mouth daily  BP today: 140/82 Prior BP: 140/60 (08/14/2009)  Labs Reviewed: K+: 4.6 (07/25/2009) Creat: : 0.9 (07/25/2009)   Chol: 153 (07/25/2009)   HDL: 35.30 (07/25/2009)   LDL: 82 (07/25/2009)   TG: 180.0 (07/25/2009)  Orders: TLB-BMP (Basic Metabolic Panel-BMET) (80048-METABOL)  Problem # 3:  DIABETES MELLITUS, TYPE II (ICD-250.00) patient states he couldn't tolerate 4 mg of glimepiride , currently on 3 mg encourage diet labs His updated medication list for this problem includes:    Micardis 80 Mg Tabs (Telmisartan) ..... One by mouth daily    Januvia 100 Mg Tabs (Sitagliptin phosphate) .Marland Kitchen... 1 by mouth once daily    Glimepiride 2 Mg Tabs (Glimepiride) .Marland Kitchen... 1.5 tabs once daily  Orders: Venipuncture (  [address]109) TLB-A1C / Hgb A1C (Glycohemoglobin) (83036-A1C)  Problem # 4:  HYPERLIPIDEMIA (ICD-272.4) right groin pain, see review of systems, OA? related to Lipitor? Plan:  observation, continue with Lipitor His updated medication list for this problem includes:    Lipitor 40 Mg Tabs (Atorvastatin calcium) .Marland Kitchen... 1 by mouth  qhs  Labs Reviewed: SGOT: 24 (07/25/2009)   SGPT: 28 (07/25/2009)   HDL:35.30 (07/25/2009), 33.10 (05/25/2009)  LDL:82 (07/25/2009), DEL (60/45/4098)  Chol:153 (07/25/2009), 201 (05/25/2009)  Trig:180.0 (07/25/2009), 171.0 (05/25/2009)  Complete Medication List: 1)  Micardis 80 Mg Tabs (Telmisartan) .... One by mouth daily 2)  Januvia 100 Mg Tabs (Sitagliptin phosphate) .Marland Kitchen.. 1 by mouth once daily 3)  Glimepiride 2 Mg Tabs (Glimepiride) .... 1.5 tabs once daily 4)  Lipitor 40 Mg Tabs (Atorvastatin calcium) .Marland Kitchen.. 1 by mouth qhs 5)  Flomax 0.4 Mg Cp24 (Tamsulosin hcl) .Marland Kitchen.. 1 by mouth once daily 6)  Freestyle Freedom Lite Test Strips  .... Check blood sugar three times a day dx 250.00 7)  Freestyle Freedom Lite Lancets  .... Check blood sugar three times a day dx 250.00 8)  Nitrostat 0.4 Mg Subl (Nitroglycerin) .Marland Kitchen.. 1 sl q 5 min as needed for  chest pain  Patient Instructions: 1)  Please schedule a follow-up appointment in 4 months .   Laboratory Results   Urine Tests    Routine Urinalysis   Color: yellow Appearance: Clear Glucose: negative   (Normal Range: Negative) Bilirubin: negative   (Normal Range: Negative) Ketone: negative   (Normal Range: Negative) Spec. Gravity: 1.010   (Normal Range: 1.003-1.035) Blood: negative   (Normal Range: Negative) pH: 5.0   (Normal Range: 5.0-8.0) Protein: negative   (Normal Range: Negative) Urobilinogen: 0.2   (Normal Range: 0-1) Nitrite: negative   (Normal Range: Negative) Leukocyte Esterace: negative   (Normal Range: Negative)

## 2010-11-06 NOTE — Progress Notes (Signed)
  Phone Note Call from Patient Call back at [facility] Phone [phone]   Summary of Call: Pt requesting rf on his cipro.... still with sxs.  frequency, urgency, incontinence, +urine odor Do you want to refill??? Shary Decamp  December 08, 2009 3:49 PM   Follow-up for Phone Call        call  10 additional days of Cipro OV in two weeks Follow-up by: Elita Quick E. Acire Tang MD,  December 08, 2009 5:10 PM  Additional Follow-up for Phone Call Additional follow up Details #1::        left detailed message on machine [facility]  December 08, 2009 5:13 PM     New/Updated Medications: CIPRO 500 MG TABS (CIPROFLOXACIN HCL) 1 by mouth two times a day Prescriptions: CIPRO 500 MG TABS (CIPROFLOXACIN HCL) 1 by mouth two times a day  #20 x 0   Entered by:   Shary Decamp   Authorized by:   Nolon Rod. Deval Mroczka MD   Signed by:   Shary Decamp on 12/08/2009   Method used:   [facility] [phone][facility] (retail)       [address]       [place], [place]  [address]       Ph: [phone]       Fax: [phone]   RxID:   [id number]

## 2010-11-06 NOTE — Assessment & Plan Note (Signed)
Summary: FOR MED REFILL//PH   Vital Signs:  Patient profile:   75 year old male Height:      66 inches Weight:      219 pounds BMI:     35.48 Pulse rate:   84 / minute BP sitting:   160 / 80  Vitals Entered By: Shary Decamp (December 27, 2009 10:57 AM) CC: rov Comments  - pt has ? about actos & [place] -- he has not been taking actos  - micardis -- $85/month  - pt d/c'd lipitor about 2 weeks ago due to myalgias -- pain is better since stopping Shary Decamp  December 27, 2009 11:05 AM    History of Present Illness: ROV  Current Medications (verified): 1)  Micardis 80 Mg Tabs (Telmisartan) .... One By Mouth Daily 2)  Januvia 100 Mg Tabs (Sitagliptin Phosphate) .Marland Kitchen.. 1 By Mouth Once Daily 3)  Glimepiride 2 Mg  Tabs (Glimepiride) .... 1.5 Tabs Once Daily 4)  Flomax 0.4 Mg  Cp24 (Tamsulosin Hcl) .Marland Kitchen.. 1 By Mouth Once Daily 5)  Freestyle Freedom Lite Test Strips .... Check Blood Sugar Three Times A Day Dx 250.00 6)  Freestyle Freedom Lite Lancets .... Check Blood Sugar Three Times A Day Dx 250.00 7)  Nitrostat 0.4 Mg Subl (Nitroglycerin) .Marland Kitchen.. 1 Sl Q 5 Min As Needed For  Chest Pain  Allergies (verified): 1)  ! Lipitor 2)  * Ace Inhibitors Group 3)  Glipizide (Glipizide) 4)  Glucophage (Metformin Hcl)  Review of Systems       DM ---->  pt has ? about actos & januvia-- should I take both??  (yes, reason behind the recommendation discussed) he has not been taking actos HTN ---->  micardis -- $85/month cholesterol ---->  patient tolerated Lipitor 20 mg okay, once he increased to 40 mg developed  myalgias that stopped after he discontinued this medication prostatitis ---->  symptoms improved dramatically with antibiotics, back to baseline  Physical Exam  General:  alert and well-developed.   Lungs:  normal respiratory effort, no intercostal retractions, no accessory muscle use, and normal breath sounds.   Heart:  normal rate, regular rhythm, no murmur, and no gallop.     Extremities:  no pretibial edema bilaterally    Impression & Recommendations:  Problem # 1:  HYPERLIPIDEMIA (ICD-272.4) intolerant to high dose of Lipitor, see review of systems  restart Lipitor 20 mg The following medications were removed from the medication list:    Lipitor 40 Mg Tabs (Atorvastatin calcium) .Marland Kitchen... 1 by mouth qhs His updated medication list for this problem includes:    Lipitor 20 Mg Tabs (Atorvastatin calcium) ..... One a day  Labs Reviewed: SGOT: 24 (07/25/2009)   SGPT: 28 (07/25/2009)   HDL:35.30 (07/25/2009), 33.10 (05/25/2009)  LDL:82 (07/25/2009), DEL (16/07/9603)  Chol:153 (07/25/2009), 201 (05/25/2009)  Trig:180.0 (07/25/2009), 171.0 (05/25/2009)  Problem # 2:  HYPERTENSION (ICD-401.9) cost is a issue change  from micardis  to losartan His updated medication list for this problem includes:    Losartan Potassium 50 Mg Tabs (Losartan potassium) ..... One by mouth twice a day  BP today: 160/80 Prior BP: 140/82 (11/21/2009)  Labs Reviewed: K+: 5.1 (11/21/2009) Creat: : 0.8 (11/21/2009)   Chol: 153 (07/25/2009)   HDL: 35.30 (07/25/2009)   LDL: 82 (07/25/2009)   TG: 180.0 (07/25/2009)  Problem # 3:  DIABETES MELLITUS, TYPE II (ICD-250.00) needs better control see review of systems, he never started Actos. plan: Start Actos   His updated medication list for this  problem includes:    Losartan Potassium 50 Mg Tabs (Losartan potassium) ..... One by mouth twice a day    Januvia 100 Mg Tabs (Sitagliptin phosphate) .Marland Kitchen... 1 by mouth once daily    Glimepiride 2 Mg Tabs (Glimepiride) .Marland Kitchen... 1.5 tabs once daily    Actos 30 Mg Tabs (Pioglitazone hcl) ..... One daily  Labs Reviewed: Creat: 0.8 (11/21/2009)    Reviewed HgBA1c results: 7.6 (11/21/2009)  7.6 (07/25/2009)  Problem # 4:  prostatitis resolved check PSA with next blood drawn  Complete Medication List: 1)  Losartan Potassium 50 Mg Tabs (Losartan potassium) .... One by mouth twice a day 2)  Januvia  100 Mg Tabs (Sitagliptin phosphate) .Marland Kitchen.. 1 by mouth once daily 3)  Glimepiride 2 Mg Tabs (Glimepiride) .... 1.5 tabs once daily 4)  Actos 30 Mg Tabs (Pioglitazone hcl) .... One daily 5)  Lipitor 20 Mg Tabs (Atorvastatin calcium) .... One a day 6)  Flomax 0.4 Mg Cp24 (Tamsulosin hcl) .Marland Kitchen.. 1 by mouth once daily 7)  Freestyle Freedom Lite Test Strips  .... Check blood sugar three times a day dx 250.00 8)  Freestyle Freedom Lite Lancets  .... Check blood sugar three times a day dx 250.00 9)  Nitrostat 0.4 Mg Subl (Nitroglycerin) .Marland Kitchen.. 1 sl q 5 min as needed for  chest pain  Patient Instructions: 1)  stop micardis , start losartan two times a day  2)  start Actos 3)  restart Lipitor at 20 mg only 4)  Please schedule a follow-up appointment in 3 months .  Prescriptions: ACTOS 30 MG TABS (PIOGLITAZONE HCL) one daily  #30 x 6   Entered and Authorized by:   Elita Quick E. Ellyce Lafevers MD   Signed by:   Nolon Rod. Erleen Egner MD on 12/27/2009   Method used:   [facility]        [facility] [phone][facility] (retail)       [address]       [place], [place]  [address]       Ph: [phone]       Fax: [phone]   RxID:   [id number] LOSARTAN POTASSIUM 50 MG TABS (LOSARTAN POTASSIUM) one by mouth twice a day  #60 x 3   Entered and Authorized by:   Nolon Rod. Selenne Coggin MD   Signed by:   Nolon Rod. Andria Head MD on 12/27/2009   Method used:   [facility] to        [facility] [phone]* (retail)       [address]       [place], [place]  [address]       Ph: [phone]       Fax: [phone]   RxID:   [id number]

## 2010-11-06 NOTE — Progress Notes (Signed)
Summary: losartan bid not covered  Phone Note Other Incoming   Summary of Call: Letter from [facility] complete:  RE: Losartan 50mg   The drug is covered on formulary.  However, the current prescription is written for more than the plan's quanity limit. Quanity limit is Maximum of 1 tablet per day.  Current rx is for two times a day    Initial call taken by: Shary Decamp,  January 02, 2010 9:17 AM  Follow-up for Phone Call        change to losartan  100 mg once a day Follow-up by: Jazzlyn Huizenga E. Naika Noto MD,  January 02, 2010 2:33 PM    New/Updated Medications: LOSARTAN POTASSIUM 100 MG TABS (LOSARTAN POTASSIUM) 1 by mouth once daily Prescriptions: LOSARTAN POTASSIUM 100 MG TABS (LOSARTAN POTASSIUM) 1 by mouth once daily  #30 x 6   Entered by:   Shary Decamp   Authorized by:   Nolon Rod. Ashe Gago MD   Signed by:   Shary Decamp on 01/02/2010   Method used:   Electronically to        [id number] (retail)       [address]       [place], [place]  [address]       Ph: [phone]       Fax: [phone]   RxID:   [id number]

## 2010-11-06 NOTE — Assessment & Plan Note (Signed)
Summary: ligament pain//lch   Vital Signs:  Patient profile:   75 year old male Height:      66 inches Weight:      220 pounds Pulse rate:   84 / minute BP sitting:   124 / 82  Vitals Entered By: Shary Decamp (Feb 05, 2010 10:51 AM) CC: c/o of pain in rt groin area last week, feeling better today   History of Present Illness: few days ago had severe pain "deep in the right groin" no mass, symptoms were worse w/  walking but as he kept walking, the symptoms decreased.  No radiation   Current Medications (verified): 1)  Losartan Potassium 100 Mg Tabs (Losartan Potassium) .Marland Kitchen.. 1 By Mouth Once Daily 2)  Januvia 100 Mg Tabs (Sitagliptin Phosphate) .Marland Kitchen.. 1 By Mouth Once Daily 3)  Glimepiride 2 Mg  Tabs (Glimepiride) .... 1.5 Tabs Once Daily 4)  Actos 30 Mg Tabs (Pioglitazone Hcl) .... One Daily 5)  Lipitor 20 Mg Tabs (Atorvastatin Calcium) .... One A Day 6)  Flomax 0.4 Mg  Cp24 (Tamsulosin Hcl) .Marland Kitchen.. 1 By Mouth Once Daily 7)  Freestyle Freedom Lite Test Strips .... Check Blood Sugar Three Times A Day Dx 250.00 8)  Freestyle Freedom Lite Lancets .... Check Blood Sugar Three Times A Day Dx 250.00 9)  Nitrostat 0.4 Mg Subl (Nitroglycerin) .Marland Kitchen.. 1 Sl Q 5 Min As Needed For  Chest Pain  Allergies (verified): 1)  ! Lipitor 2)  * Ace Inhibitors Group 3)  Glipizide (Glipizide) 4)  Glucophage (Metformin Hcl)  Past History:  Past Medical History: Reviewed history from 11/21/2009 and no changes required. DIABETES MELLITUS, TYPE II  HYPERLIPIDEMIA (ICD-272.4) HYPERTENSION (ICD-401.9) BENIGN PROSTATIC HYPERTROPHY, MILD, HX OF (ICD-V13.8) Allergic rhinitis    Past Surgical History: Reviewed history from 05/18/2008 and no changes required. Denies surgical history  Review of Systems       no fever, no inguinal  mass no dysuria or hematuria no abdominal pain still has some urinary urgency   Physical Exam  General:  alert and well-developed.   Abdomen:  soft, non-tender, no  distention, and no masses.  no inguinal hernia or mass Genitalia:  uncircumcised, no hydrocele, no varicocele, no scrotal masses, no cutaneous lesions, and no urethral discharge.    Prostate:  Prostate gland firm and smooth, no nodularity, tenderness, mass, asymmetry or induration. Slt increased in size? Msk:  rotation of the hips showed no discomfort   Impression & Recommendations:  Problem # 1:  INGUINAL PAIN, RIGHT (ICD-789.09) no evidence of a hernia pain related to OA of the hip? plan: xr , tylenol   Orders: UA Dipstick w/o Micro (manual) (16109) T-Hip Comp Right Min 2 views (73510TC)  Problem # 2:  HYPERTROPHY PROSTATE W/O UR OBST & OTH LUTS (ICD-600.00) diagnosed with prostatitis 2-11 based on his symptoms, DRE  was normal PSA increased to 10.99. Status post antibiotics acute symptoms resolve, he still have some urgency DRE today essentially neg, ? slightly  enlargment plan: PSA , should be back to normal after abx  likes a referal to uorlogy in [place] (saw Dr Jeralyn Bennett before); I'll wait and see the PSA and see if needs a referal   Orders: Venipuncture (60454) TLB-PSA (Prostate Specific Antigen) (84153-PSA)  Complete Medication List: 1)  Losartan Potassium 100 Mg Tabs (Losartan potassium) .Marland Kitchen.. 1 by mouth once daily 2)  Januvia 100 Mg Tabs (Sitagliptin phosphate) .Marland Kitchen.. 1 by mouth once daily 3)  Glimepiride 2 Mg Tabs (Glimepiride) .... 1.5  tabs once daily 4)  Actos 30 Mg Tabs (Pioglitazone hcl) .... One daily 5)  Lipitor 20 Mg Tabs (Atorvastatin calcium) .... One a day 6)  Flomax 0.4 Mg Cp24 (Tamsulosin hcl) .Marland Kitchen.. 1 by mouth once daily 7)  Freestyle Freedom Lite Test Strips  .... Check blood sugar three times a day dx 250.00 8)  Freestyle Freedom Lite Lancets  .... Check blood sugar three times a day dx 250.00 9)  Nitrostat 0.4 Mg Subl (Nitroglycerin) .Marland Kitchen.. 1 sl q 5 min as needed for  chest pain  Patient Instructions: 1)  Please schedule a follow-up appointment in 1 month.    Laboratory Results   Urine Tests    Routine Urinalysis   Glucose: negative   (Normal Range: Negative) Bilirubin: negative   (Normal Range: Negative) Ketone: negative   (Normal Range: Negative) Spec. Gravity: 1.010   (Normal Range: 1.003-1.035) Blood: negative   (Normal Range: Negative) pH: 6.0   (Normal Range: 5.0-8.0) Protein: negative   (Normal Range: Negative) Urobilinogen: 0.2   (Normal Range: 0-1) Nitrite: negative   (Normal Range: Negative) Leukocyte Esterace: negative   (Normal Range: Negative)

## 2010-11-08 NOTE — Progress Notes (Signed)
Summary: appt  Phone Note Outgoing Call   Call placed by: Army Fossa CMA,  October 18, 2010 1:05 PM Summary of Call: Please have pt schedule a f/u visit w/ Dr.Paz.   Follow-up for Phone Call        Patient is coming in tomorrow 1.13.11 @ 12:40.Harold Barban  October 18, 2010 1:39 PM

## 2010-11-08 NOTE — Assessment & Plan Note (Signed)
Summary: roa//lch   Vital Signs:  Patient profile:   75 year old male Height:      66 inches Weight:      226.25 pounds BMI:     36.65 Pulse rate:   74 / minute Pulse rhythm:   regular BP sitting:   140 / 86  (left arm) Cuff size:   regular  Vitals Entered By: Army Fossa CMA (October 19, 2010 1:12 PM) CC: 6 month f/u- not fasting  Comments [facility] [address]   History of Present Illness: ROV complained of his urine "smelled bad" for a while Having some more difficulty urinating, Flomax use to help better. He is also gaining weight despite the fact that he eats "very little", denies eating excessive fats or  carbohydrates. He feels bloated frequently  ROS note taking Actos (afraid of cancer)  or Lipitor  (patient read about statins and doesn't like to take ) No ambulatory CBGs He had a flu shot No nausea, vomiting, diarrhea. No blood in the stools Bowel movements are normal and daily. No dysuria or gross hematuria     Current Medications (verified): 1)  Losartan Potassium 100 Mg Tabs (Losartan Potassium) .Marland Kitchen.. 1 By Mouth Once Daily. Due For Office Visit Before Additional Refills. 2)  Januvia 100 Mg Tabs (Sitagliptin Phosphate) .Marland Kitchen.. 1 By Mouth Once Daily 3)  Glimepiride 2 Mg  Tabs (Glimepiride) .... 1.5 Tabs Once Daily. Due For Ov Before Additional Refills. 4)  Flomax 0.4 Mg  Cp24 (Tamsulosin Hcl) .Marland Kitchen.. 1 By Mouth Once Daily 5)  Freestyle Freedom Lite Test Strips .... Check Blood Sugar Three Times A Day Dx 250.00 6)  Freestyle Freedom Lite Lancets .... Check Blood Sugar Three Times A Day Dx 250.00 7)  Nitrostat 0.4 Mg Subl (Nitroglycerin) .Marland Kitchen.. 1 Sl Q 5 Min As Needed For  Chest Pain  Allergies (verified): 1)  ! Lipitor 2)  * Ace Inhibitors Group 3)  Glipizide (Glipizide) 4)  Glucophage (Metformin Hcl)  Past History:  Past Medical History: Reviewed history from 03/09/2010 and no changes required. DIABETES MELLITUS, TYPE II  HYPERLIPIDEMIA  (ICD-272.4) HYPERTENSION (ICD-401.9) BENIGN PROSTATIC HYPERTROPHY, MILD, HX OF (ICD-V13.8) Allergic rhinitis    Past Surgical History: Reviewed history from 05/18/2008 and no changes required. Denies surgical history  Social History: Reviewed history from 06/06/2009 and no changes required. from [place] Single, divorced 5 children Remote tobacco-none since 1984 (Smoked 2ppd for 20 years) No alcohol No illicit drugs Aerial surveyor-semi retired  Physical Exam  General:  alert, well-developed, and overweight-appearing.  has gained 4 pounds since her last office visit, prominent central obesity Lungs:  normal respiratory effort, no intercostal retractions, no accessory muscle use, and normal breath sounds.   Heart:  normal rate, regular rhythm, no murmur, and no gallop.   Abdomen:  soft, non-tender,   no masses, no guarding, and no rigidity.  he has prominent central obesity but no distention per se   Impression & Recommendations:  Problem # 1:  HYPERLIPIDEMIA (ICD-272.4) not taking Lipitor, afraid of statins in general  The following medications were removed from the medication list:    Lipitor 20 Mg Tabs (Atorvastatin calcium) ..... One a day  Labs Reviewed: SGOT: 24 (07/25/2009)   SGPT: 28 (07/25/2009)   HDL:39.50 (03/15/2010), 35.30 (07/25/2009)  LDL:82 (07/25/2009), DEL (98/08/9146)  Chol:223 (03/15/2010), 153 (07/25/2009)  Trig:134.0 (03/15/2010), 180.0 (07/25/2009)  Problem # 2:  HYPERTENSION (ICD-401.9) no change  His updated medication list for this problem includes:    Losartan Potassium 100  Mg Tabs (Losartan potassium) .Marland Kitchen... 1 by mouth once daily.    BP today: 140/86 Prior BP: 140/76 (03/09/2010)  Labs Reviewed: K+: 4.5 (03/15/2010) Creat: : 0.9 (03/15/2010)   Chol: 223 (03/15/2010)   HDL: 39.50 (03/15/2010)   LDL: 82 (07/25/2009)   TG: 134.0 (03/15/2010)  Problem # 3:  OBESITY (ICD-278.00) gaining weight despite self described healthy  diet. nutitionist  referral-- will call if interested   Orders: Venipuncture (02725) TLB-TSH (Thyroid Stimulating Hormone) (84443-TSH) Specimen Handling (36644)  Problem # 4:  DIABETES MELLITUS, TYPE II (ICD-250.00) no taking Actos, treatment limited by patient's compliance  The following medications were removed from the medication list:    Actos 30 Mg Tabs (Pioglitazone hcl) ..... One daily His updated medication list for this problem includes:    Losartan Potassium 100 Mg Tabs (Losartan potassium) .Marland Kitchen... 1 by mouth once daily.    Januvia 100 Mg Tabs (Sitagliptin phosphate) .Marland Kitchen... 1 by mouth once daily    Glimepiride 2 Mg Tabs (Glimepiride) .Marland Kitchen... 1.5 tabs once daily.    Labs Reviewed: Creat: 0.9 (03/15/2010)    Reviewed HgBA1c results: 6.6 (03/15/2010)  7.6 (11/21/2009)  Orders: TLB-A1C / Hgb A1C (Glycohemoglobin) (83036-A1C) Specimen Handling (03474)  Problem # 5:  HYPERTROPHY PROSTATE W/O UR OBST & OTH LUTS (ICD-600.00) ongoing symptoms Referral to urology if so desired likes to try OTCs for "prostate health" before referal   Complete Medication List: 1)  Losartan Potassium 100 Mg Tabs (Losartan potassium) .Marland Kitchen.. 1 by mouth once daily. 2)  Januvia 100 Mg Tabs (Sitagliptin phosphate) .Marland Kitchen.. 1 by mouth once daily 3)  Glimepiride 2 Mg Tabs (Glimepiride) .... 1.5 tabs once daily. 4)  Flomax 0.4 Mg Cp24 (Tamsulosin hcl) .Marland Kitchen.. 1 by mouth once daily 5)  Freestyle Freedom Lite Test Strips  .... Check blood sugar three times a day dx 250.00 6)  Freestyle Freedom Lite Lancets  .... Check blood sugar three times a day dx 250.00 7)  Nitrostat 0.4 Mg Subl (Nitroglycerin) .Marland Kitchen.. 1 sl q 5 min as needed for  chest pain  Other Orders: TLB-BMP (Basic Metabolic Panel-BMET) (80048-METABOL) TLB-CBC Platelet - w/Differential (85025-CBCD) UA Dipstick W/ Micro (manual) (25956)  Patient Instructions: 1)  Please schedule a follow-up appointment in 4 months .fasting, physical exam   Orders  Added: 1)  Venipuncture [36415] 2)  TLB-BMP (Basic Metabolic Panel-BMET) [80048-METABOL] 3)  TLB-CBC Platelet - w/Differential [85025-CBCD] 4)  TLB-A1C / Hgb A1C (Glycohemoglobin) [83036-A1C] 5)  TLB-TSH (Thyroid Stimulating Hormone) [84443-TSH] 6)  UA Dipstick W/ Micro (manual) [81000] 7)  Specimen Handling [99000] 8)  Est. Patient Level III [38756]    Laboratory Results   Urine Tests   Date/Time Reported: October 19, 2010 1:34 PM   Routine Urinalysis   Color: lt. yellow Appearance: Clear Glucose: negative   (Normal Range: Negative) Bilirubin: negative   (Normal Range: Negative) Ketone: negative   (Normal Range: Negative) Spec. Gravity: <1.005   (Normal Range: 1.003-1.035) Blood: negative   (Normal Range: Negative) pH: 6.5   (Normal Range: 5.0-8.0) Protein: negative   (Normal Range: Negative) Urobilinogen: negative   (Normal Range: 0-1) Nitrite: negative   (Normal Range: Negative) Leukocyte Esterace: negative   (Normal Range: Negative)    Comments: Floydene Flock  October 19, 2010 1:34 PM

## 2011-01-01 ENCOUNTER — Other Ambulatory Visit: Payer: Self-pay | Admitting: Internal Medicine

## 2011-01-09 LAB — GLUCOSE, CAPILLARY: Glucose-Capillary: 163 mg/dL — ABNORMAL HIGH (ref 70–99)

## 2011-02-07 ENCOUNTER — Encounter: Payer: Self-pay | Admitting: Internal Medicine

## 2011-02-11 ENCOUNTER — Encounter: Payer: Self-pay | Admitting: Internal Medicine

## 2011-02-11 ENCOUNTER — Ambulatory Visit (INDEPENDENT_AMBULATORY_CARE_PROVIDER_SITE_OTHER): Payer: Medicare Other | Admitting: Internal Medicine

## 2011-02-11 VITALS — BP 142/72 | HR 80 | Wt 227.2 lb

## 2011-02-11 DIAGNOSIS — N400 Enlarged prostate without lower urinary tract symptoms: Secondary | ICD-10-CM

## 2011-02-11 DIAGNOSIS — E785 Hyperlipidemia, unspecified: Secondary | ICD-10-CM

## 2011-02-11 DIAGNOSIS — E119 Type 2 diabetes mellitus without complications: Secondary | ICD-10-CM

## 2011-02-11 DIAGNOSIS — Z87898 Personal history of other specified conditions: Secondary | ICD-10-CM

## 2011-02-11 DIAGNOSIS — I10 Essential (primary) hypertension: Secondary | ICD-10-CM

## 2011-02-11 LAB — POCT URINALYSIS DIPSTICK
Bilirubin, UA: NEGATIVE
Blood, UA: NEGATIVE
Glucose, UA: NEGATIVE
Leukocytes, UA: NEGATIVE
Nitrite, UA: NEGATIVE

## 2011-02-11 MED ORDER — LOSARTAN POTASSIUM 100 MG PO TABS: 100.0000 mg | ORAL_TABLET | Freq: Every day | ORAL | Status: DC

## 2011-02-11 MED ORDER — SITAGLIPTIN PHOSPHATE 100 MG PO TABS: ORAL_TABLET | ORAL | Status: DC

## 2011-02-11 MED ORDER — GLIMEPIRIDE 2 MG PO TABS: ORAL_TABLET | ORAL | Status: DC

## 2011-02-11 NOTE — Assessment & Plan Note (Signed)
Not well controlled, today states he won't take lipitor. Cconsistently declining to take meds

## 2011-02-11 NOTE — Assessment & Plan Note (Signed)
In the past has declined to take actos. Treatment limited by compliance

## 2011-02-11 NOTE — Assessment & Plan Note (Signed)
Ongoing sx, refer to urology in [place]

## 2011-02-11 NOTE — Assessment & Plan Note (Signed)
BP < 140/80 No change.

## 2011-02-11 NOTE — Progress Notes (Signed)
  Subjective:    Patient ID: Allen Gomez, male    DOB: Feb 08, 1936, 75 y.o.   MRN: [id number]  HPI BPH-- ongoing sx , see previous note, needs referal  DM-- worrried about his wt, likes to do a "colon cleanse", last CBG 117  HTN-- amb BP 140/70-80  Past Medical History  Diagnosis Date  . Diabetes mellitus   . Hyperlipidemia   . Hypertension   . BPH (benign prostatic hypertrophy)   . Allergic rhinitis     No past surgical history on file.   Social History: from [place] Single, divorced 5 children Remote tobacco-none since 1984 (Smoked 2ppd for 20 years) No alcohol No illicit drugs Aerial surveyor-semi retired  Review of Systems  Respiratory: Negative for cough and shortness of breath.   Cardiovascular: Negative for chest pain and leg swelling.  Genitourinary: Negative for dysuria and hematuria.       Some frequency and "dripping"       Objective:   Physical Exam  Constitutional: He is oriented to person, place, and time. He appears well-developed.       + central obesity  Cardiovascular: Normal rate.   No murmur heard. Pulmonary/Chest: Effort normal and breath sounds normal. No respiratory distress. He has no wheezes. He has no rales.  Musculoskeletal: He exhibits no edema.  Neurological: He is alert and oriented to person, place, and time.  Psychiatric: He has a normal mood and affect. His behavior is normal. Judgment and thought content normal.          Assessment & Plan:

## 2011-02-12 LAB — LIPID PANEL
Cholesterol: 233 mg/dL — ABNORMAL HIGH (ref 0–200)
Triglycerides: 152 mg/dL — ABNORMAL HIGH (ref 0.0–149.0)

## 2011-02-13 ENCOUNTER — Other Ambulatory Visit: Payer: Self-pay | Admitting: Internal Medicine

## 2011-02-14 ENCOUNTER — Encounter: Payer: Self-pay | Admitting: Internal Medicine

## 2011-02-19 NOTE — Assessment & Plan Note (Signed)
[facility]                         GASTROENTEROLOGY OFFICE NOTE   KAHARI, CRITZER                         MRN:          [id number]  DATE:03/26/2007                            DOB:          04-Dec-1935    REASON FOR CONSULTATION:  Change in bowel habit, increase in intestinal  gas, and colonoscopy.   HISTORY:  This is a 75 year old male with a history of hypertension,  diabetes, and hyperlipidemia.  He is referred through the courtesy of  Dr. Drue Novel regarding the above-listed issues.  The patient reports to me  that he underwent routine screening colonoscopy at the [facility] in  [place], [place], less than 3 years ago.  He states that there were  no abnormalities on the examination.  He is under the impression that he  should undergo colonoscopy annually.  In terms of GI symptoms, he does  report worsening problems with postprandial and evening fullness or  bloating which is improved after belching or passing flatus.  For the  past several years, he has gained 25 pounds.  His bowel habits have  actually been stable with no predominant constipation or diarrhea.  There has been no rectal bleeding.  No upper GI complaints.  No  abdominal pain.   PAST MEDICAL HISTORY:  As above.   PAST SURGICAL HISTORY:  None.   ALLERGIES:  No known drug allergies.   CURRENT MEDICATIONS:  (Dosages unknown) Zocor, Januvia, and Micardis.   FAMILY HISTORY:  No family history of colon cancer or colon polyps.   SOCIAL HISTORY:  The patient is divorced with two daughters.  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  He does not  smoke and rarely uses alcohol.   REVIEW OF SYSTEMS:  Per diagnostic evaluation form.   PHYSICAL EXAMINATION:  GENERAL:  Well-appearing male in no acute  distress.  He is alert and oriented x3.  VITAL SIGNS:  Blood pressure 142/72, heart rate 78 and regular, weight  is 213 pounds, he is 5 feet 6-and-a-half  inches in height.  HEENT:  Sclerae are anicteric, conjunctivae are pink, oral mucosa is  intact.  There is no adenopathy.  LUNGS:  Clear.  HEART:  Regular.  ABDOMEN:  Obese and soft without tenderness, mass or hernia.  Good bowel  sounds heard.   IMPRESSION:  1. Screening colonoscopy.  The patient apparently underwent complete      screening colonoscopy, which was negative, three years ago at the      [facility].  Currently he is asymptomatic and does not have a      family history of colon cancer.  There is no obvious indication for      colonoscopy at this time.  I reviewed with him in detail current      guidelines on colon cancer screening.  I did stress to him that it      would be important that I review his records from the [facility]      to ensure an adequate, complete exam with no significant  abnormalities.  He agreed to provide those records.  2. Increased intestinal gas, bloating and fullness.  Uncomfortable      feeling certainly exacerbated by progressive obesity.  I would like      to empirically treat him with the probiotic Align.  In addition, I      have provided him with literature on increased intestinal gas and      flatulence.He needs to lose weight.     Wilhemina Bonito. Marina Goodell, MD  Electronically Signed    JNP/MedQ  DD: 03/26/2007  DT: 03/26/2007  Job #: [id number]   cc:   Willow Ora, MD

## 2011-06-19 ENCOUNTER — Other Ambulatory Visit: Payer: Self-pay | Admitting: Internal Medicine

## 2011-06-19 ENCOUNTER — Ambulatory Visit (INDEPENDENT_AMBULATORY_CARE_PROVIDER_SITE_OTHER): Payer: Medicare Other | Admitting: Internal Medicine

## 2011-06-19 ENCOUNTER — Encounter: Payer: Self-pay | Admitting: Internal Medicine

## 2011-06-19 VITALS — BP 134/72 | Temp 98.2°F | Wt 224.0 lb

## 2011-06-19 DIAGNOSIS — E119 Type 2 diabetes mellitus without complications: Secondary | ICD-10-CM

## 2011-06-19 DIAGNOSIS — I10 Essential (primary) hypertension: Secondary | ICD-10-CM

## 2011-06-19 DIAGNOSIS — E785 Hyperlipidemia, unspecified: Secondary | ICD-10-CM

## 2011-06-19 DIAGNOSIS — R109 Unspecified abdominal pain: Secondary | ICD-10-CM

## 2011-06-19 LAB — POCT URINALYSIS DIPSTICK
Bilirubin, UA: NEGATIVE
Blood, UA: NEGATIVE
Glucose, UA: NEGATIVE
Ketones, UA: NEGATIVE
Spec Grav, UA: 1.01

## 2011-06-19 NOTE — Assessment & Plan Note (Signed)
BP ok today

## 2011-06-19 NOTE — Assessment & Plan Note (Addendum)
Long  history of poor  despite my advice. I printed information about januvia from up-to-date. While  I respect his research and I cannot be 100% sure that [place] medication will not cause any problems in the future, at the same time I prescribe it without much hesitation since is a common practice within the medical community at this point in time. Plan: Discontinue Januvia as the patient feels uncomfortable taking it. Check A1c Refer to endocrinology

## 2011-06-19 NOTE — Progress Notes (Signed)
  Subjective:    Patient ID: Allen Gomez, male    DOB: 22-Nov-1935, 75 y.o.   MRN: [id number]  HPI ROV, multiple issues: Bump by the L ear noted few weeks ago, resolved. Diabetes--stays that he had a lot of time to check Januvia in internet  and he does not like to continue taking it because it causes "all sorts of cancers". Cholesterol--base and the last cholesterol panel, I recommended Pravachol,  elected not to start Complaining of occasional lower abdominal pain, sharp, crampy?Marland Kitchen Usually after dinner, not relieved by bowel movements. At the same time, he has some ill defined right back pain. Denies any dysuria or gross hematuria, no blood in the urine. No nausea, vomiting, no blood in the stools.  Past Medical History  Diagnosis Date  . Diabetes mellitus   . Hyperlipidemia   . Hypertension   . BPH (benign prostatic hypertrophy)   . Allergic rhinitis     No past surgical history on file.   Review of Systems See history of present illness    Objective:   Physical Exam  Constitutional: He is oriented to person, place, and time. He appears well-developed. No distress.       Overweight appearing  Cardiovascular: Normal rate, regular rhythm and normal heart sounds.   No murmur heard. Pulmonary/Chest: Effort normal and breath sounds normal. No respiratory distress. He has no wheezes. He has no rales.  Abdominal:       Soft, nondistended, mild tenderness at the lower abdomen bilaterally.  Musculoskeletal: He exhibits no edema.       Not tender to palpation of the thoracic spine, no CVA tenderness.   Neurological: He is alert and oriented to person, place, and time.  Skin: He is not diaphoretic.          Assessment & Plan:

## 2011-06-19 NOTE — Assessment & Plan Note (Signed)
Long h/o poor compliance with my advice in general, statins in particular rec diet-exercise

## 2011-06-19 NOTE — Assessment & Plan Note (Addendum)
Ill defined lower abdominal pain and right back pain. Urinalysis negative, review of systems essentially negative.  Plan: iFOB provided  CBC If symptoms persists, further workup; pt advised to call if no better

## 2011-06-20 LAB — CBC WITH DIFFERENTIAL/PLATELET
Basophils Relative: 1.6 % (ref 0.0–3.0)
Eosinophils Absolute: 0.4 10*3/uL (ref 0.0–0.7)
Hemoglobin: 13.8 g/dL (ref 13.0–17.0)
MCHC: 33 g/dL (ref 30.0–36.0)
MCV: 92.2 fl (ref 78.0–100.0)
Monocytes Absolute: 0.8 10*3/uL (ref 0.1–1.0)
Neutro Abs: 2.3 10*3/uL (ref 1.4–7.7)
RBC: 4.53 Mil/uL (ref 4.22–5.81)

## 2011-06-20 LAB — COMPREHENSIVE METABOLIC PANEL
Albumin: 4.2 g/dL (ref 3.5–5.2)
Alkaline Phosphatase: 52 U/L (ref 39–117)
BUN: 10 mg/dL (ref 6–23)
CO2: 26 mEq/L (ref 19–32)
GFR: 108.55 mL/min (ref >60.00)
Glucose, Bld: 108 mg/dL — ABNORMAL HIGH (ref 70–99)
Potassium: 4.1 mEq/L (ref 3.5–5.1)
Total Bilirubin: 0.5 mg/dL (ref 0.3–1.2)

## 2011-06-24 ENCOUNTER — Telehealth: Payer: Self-pay

## 2011-06-24 NOTE — Telephone Encounter (Signed)
Message copied by Beverely Low on Mon Jun 24, 2011  9:26 AM ------      Message from: Wanda Plump      Created: Sat Jun 22, 2011  2:34 PM       Advise patient      Diabetes was improving with Alma Friendly, will proceed with endocrinology referral as planned      Other labs wnl

## 2011-06-24 NOTE — Telephone Encounter (Signed)
Pt aware.

## 2011-06-26 ENCOUNTER — Encounter: Payer: Self-pay | Admitting: Internal Medicine

## 2011-06-26 ENCOUNTER — Ambulatory Visit (INDEPENDENT_AMBULATORY_CARE_PROVIDER_SITE_OTHER)
Admission: RE | Admit: 2011-06-26 | Discharge: 2011-06-26 | Disposition: A | Discharge status: Home / Self Care | Payer: Medicare Other | Source: Ambulatory Visit | Attending: Internal Medicine | Admitting: Internal Medicine

## 2011-06-26 ENCOUNTER — Ambulatory Visit (INDEPENDENT_AMBULATORY_CARE_PROVIDER_SITE_OTHER): Payer: Medicare Other | Admitting: Internal Medicine

## 2011-06-26 DIAGNOSIS — M79673 Pain in unspecified foot: Secondary | ICD-10-CM

## 2011-06-26 DIAGNOSIS — E119 Type 2 diabetes mellitus without complications: Secondary | ICD-10-CM

## 2011-06-26 DIAGNOSIS — M79609 Pain in unspecified limb: Secondary | ICD-10-CM

## 2011-06-26 NOTE — Patient Instructions (Signed)
Motrin or tylenol as needed for foot pain, XR, call if no better in 2 weeks LAMISIL OTC cream between toes on the R  Foot twice a day x 1 week

## 2011-06-26 NOTE — Assessment & Plan Note (Signed)
Feet exam normal today,  does have a mild fungal infex, see Rx

## 2011-06-26 NOTE — Assessment & Plan Note (Signed)
Sprain? XR, See instructions

## 2011-06-26 NOTE — Progress Notes (Signed)
  Subjective:    Patient ID: Allen Gomez, male    DOB: [birth year], 75 y.o.   MRN: [id number]  HPI 1 month h/o pain in the external aspect of the L foot and lately even in the dorsum  Past Medical History  Diagnosis Date  . Diabetes mellitus   . Hyperlipidemia   . Hypertension   . BPH (benign prostatic hypertrophy)   . Allergic rhinitis    No past surgical history on file.  Review of Systems No swelling or redness in the foot No injury or over use although has been doing yard work     Objective:   Physical Exam  Constitutional: He appears well-developed. No distress.  Musculoskeletal:       DIABETIC FEET EXAM: No lower extremity edema Normal pedal pulses bilaterally Skin : some maceration (mild) between toes on the R foot. nails are normal, no calluses Pinprick examination of the feet normal.  Ankles , toes normal to inspection and passive manipulation   Skin: He is not diaphoretic.          Assessment & Plan:

## 2011-06-27 ENCOUNTER — Other Ambulatory Visit: Payer: Medicare Other

## 2011-06-27 ENCOUNTER — Other Ambulatory Visit: Payer: Self-pay | Admitting: Internal Medicine

## 2011-06-27 DIAGNOSIS — Z1211 Encounter for screening for malignant neoplasm of colon: Secondary | ICD-10-CM

## 2011-06-27 LAB — FECAL OCCULT BLOOD, IMMUNOCHEMICAL: Fecal Occult Bld: NEGATIVE

## 2011-06-28 ENCOUNTER — Ambulatory Visit: Payer: Medicare Other | Admitting: Endocrinology

## 2011-07-09 ENCOUNTER — Encounter: Payer: Self-pay | Admitting: Internal Medicine

## 2011-07-09 ENCOUNTER — Ambulatory Visit (INDEPENDENT_AMBULATORY_CARE_PROVIDER_SITE_OTHER): Payer: Medicare Other | Admitting: Internal Medicine

## 2011-07-09 VITALS — BP 148/78 | HR 82 | Temp 98.6°F | Resp 14 | Wt 225.4 lb

## 2011-07-09 DIAGNOSIS — M79609 Pain in unspecified limb: Secondary | ICD-10-CM

## 2011-07-09 DIAGNOSIS — Z23 Encounter for immunization: Secondary | ICD-10-CM

## 2011-07-09 DIAGNOSIS — M79673 Pain in unspecified foot: Secondary | ICD-10-CM

## 2011-07-09 LAB — URIC ACID: Uric Acid, Serum: 5.2 mg/dL (ref 4.0–7.8)

## 2011-07-09 NOTE — Patient Instructions (Signed)
LAMISIL OTC cream between toes on the R Foot twice a day x 1 week

## 2011-07-09 NOTE — Assessment & Plan Note (Addendum)
Unclear etiology He has a slt hypotrophic L calf, subtle decrease motor strength distally in the leg (neuropathy?) Plan: Ortho referral  will check a Uric acid although gout is unlikely

## 2011-07-10 ENCOUNTER — Ambulatory Visit (INDEPENDENT_AMBULATORY_CARE_PROVIDER_SITE_OTHER): Payer: Medicare Other | Admitting: Endocrinology

## 2011-07-10 ENCOUNTER — Encounter: Payer: Self-pay | Admitting: Endocrinology

## 2011-07-10 VITALS — BP 144/72 | HR 82 | Temp 98.6°F | Ht 66.0 in | Wt 225.6 lb

## 2011-07-10 DIAGNOSIS — E119 Type 2 diabetes mellitus without complications: Secondary | ICD-10-CM

## 2011-07-10 NOTE — Progress Notes (Signed)
  Subjective:    Patient ID: Allen Gomez, male    DOB: 1936-03-19, 75 y.o.   MRN: [id number]  HPI Continue with pain at the left foot.  Past Medical History  Diagnosis Date  . Diabetes mellitus   . Hyperlipidemia   . Hypertension   . BPH (benign prostatic hypertrophy)   . Allergic rhinitis    No past surgical history on file.   Review of Systems Area is not red, swollen or hot.    Objective:   Physical Exam Alert oriented, in no apparent distress. Right leg normal Left leg: Soft is slightly smaller compared to the right by approximately 1 cm, nontender and soft. Foot examination is normal without any obvious deformity. Neurological exam: Reflexes symmetric, distal strength on the left leg slightly decreased compared to the right?       Assessment & Plan:

## 2011-07-10 NOTE — Patient Instructions (Addendum)
good diet and exercise habits significanly improve the control of your diabetes.  please let me know if you wish to be referred for weight-loss surgery.  high blood sugar is very risky to your health.  you should see an eye doctor every year. controlling your blood pressure and cholesterol drastically reduces the damage diabetes does to your body.  this also applies to quitting smoking.  please discuss these with your doctor.  you should take an aspirin every day, unless you have been advised by a doctor not to. check your blood sugar 1 time a day.  vary the time of day when you check, between before the 3 meals, and at bedtime.  also check if you have symptoms of your blood sugar being too high or too low.  please keep a record of the readings and bring it to your next appointment here.  please call us sooner if you are having low blood sugar episodes, or if it stays over 200.   Please come back for a follow-up appointment in January.   Refer to a dietician.  you will receive a phone call, about a day and time for an appointment. Change glimepiride to januvia 100 mg each morning.  Here are some samples.

## 2011-07-10 NOTE — Progress Notes (Signed)
Subjective:    Patient ID: Allen Gomez, male    DOB: June 30, 1936, 75 y.o.   MRN: [id number]  HPI pt states 10 years h/o dm.  he is unaware of any chronic complications.  he has never been on insulin.  He does not check cbg's.  pt says his diet and exercise are not good.  Symptomatically, pt states few years of intermittent pain at the feet, but no assoc numbness.   He says metformin caused dizziness.  He stopped actos and [place],  due to concerns about cancer.   Past Medical History  Diagnosis Date  . Diabetes mellitus   . Hyperlipidemia   . Hypertension   . BPH (benign prostatic hypertrophy)   . Allergic rhinitis    No past surgical history on file.  History   Social History  . Marital Status: Divorced    Spouse Name: N/A    Number of Children: 5  . Years of Education: N/A   Occupational History  . Aerial Surveyor-semi retired     Social History Main Topics  . Smoking status: Former Games developer  . Smokeless tobacco: Not on file   Comment: quit 30 years ago  . Alcohol Use: No  . Drug Use: No  . Sexually Active: Not on file   Other Topics Concern  . Not on file   Social History Narrative   From [place]    Current Outpatient Prescriptions on File Prior to Visit  Medication Sig Dispense Refill  . fish oil-omega-3 fatty acids 1000 MG capsule Take 1 g by mouth daily.        Marland Kitchen glimepiride (AMARYL) 2 MG tablet 1.5 tabs once daily.  45 tablet  3  . glucose blood (FREESTYLE LITE) test strip 1 each by Other route as needed. Use as instructed       . Lancets (FREESTYLE) lancets 1 each by Other route as needed. Use as instructed       . losartan (COZAAR) 100 MG tablet TAKE 1 TABLET (100 MG TOTAL) BY MOUTH DAILY.  90 tablet  1  . Multiple Vitamin (MULTIVITAMIN) tablet Take 1 tablet by mouth daily.        . nitroGLYCERIN (NITROSTAT) 0.4 MG SL tablet Place 0.4 mg under the tongue every 5 (five) minutes as needed.        . NON FORMULARY Take 1 each by mouth daily. MAGIC PROSTATE  SUPPLEMENT.       . Tamsulosin HCl (FLOMAX) 0.4 MG CAPS TAKE ONE CAPSULE BY MOUTH EVERY DAY  30 capsule  2  . vitamin C (ASCORBIC ACID) 500 MG tablet Take 500 mg by mouth daily.          Allergies  Allergen Reactions  . Ace Inhibitors     REACTION: cough  . Atorvastatin     REACTION: myalgia  . Glipizide     REACTION: pt did not like med  . Metformin     REACTION: pt did not like    Family History  Problem Relation Age of Onset  . Colon cancer Neg Hx   . Prostate cancer Neg Hx   . Diabetes Neg Hx   . Heart attack      uncle age 53  . Stroke Mother 65    BP 144/72  Pulse 82  Temp(Src) 98.6 F (37 C) (Oral)  Ht 5\' 6"  (1.676 m)  Wt 225 lb 9.6 oz (102.331 kg)  BMI 36.41 kg/m2  SpO2 97%    Review of  Systems denies blurry vision, chest pain, sob, n/v, cramps, excessive diaphoresis, memory loss, depression, hypoglycemia, and easy bruising.  He has weight gain, rhinorrhea, and urinary frequency.  He has intermittent headache.      Objective:   Physical Exam VS: see vs page GEN: no distress HEAD: head: no deformity eyes: no periorbital swelling, no proptosis external nose and ears are normal mouth: no lesion seen NECK: supple, thyroid is not enlarged CHEST WALL: no deformity LUNGS: clear to auscultation CV: reg rate and rhythm, no murmur ABD: abdomen is soft, nontender.  no hepatosplenomegaly.  not distended.  There is a self-reducing midline ventral hernia MUSCULOSKELETAL: muscle bulk and strength are grossly normal.  no obvious joint swelling.  gait is normal and steady EXTEMITIES: no deformity.  no ulcer on the feet.  feet are of normal color and temp.  1+ bilat leg edema PULSES: dorsalis pedis intact bilat.  no carotid bruit. NEURO:  cn 2-12 grossly intact.   readily moves all 4's.  sensation is intact to touch on the feet SKIN:  Normal texture and temperature.  No rash or suspicious lesion is visible.   NODES:  None palpable at the neck PSYCH: alert, oriented  x3.  Does not appear anxious nor depressed.    Lab Results  Component Value Date   HGBA1C 7.1* 06/19/2011       Assessment & Plan:  Type 2 dm, Needs increased rx, if it can be done with a regimen that avoids or minimizes hypoglycemia. Edema is a relative contraindication to actos.  Dizziness, due to actos.

## 2011-07-11 ENCOUNTER — Other Ambulatory Visit: Payer: Self-pay | Admitting: Internal Medicine

## 2011-08-14 ENCOUNTER — Ambulatory Visit (INDEPENDENT_AMBULATORY_CARE_PROVIDER_SITE_OTHER): Payer: Medicare Other | Admitting: Internal Medicine

## 2011-08-14 ENCOUNTER — Encounter: Payer: Self-pay | Admitting: Internal Medicine

## 2011-08-14 VITALS — BP 138/62 | HR 86 | Temp 98.2°F | Resp 18 | Ht 65.35 in | Wt 218.0 lb

## 2011-08-14 DIAGNOSIS — E119 Type 2 diabetes mellitus without complications: Secondary | ICD-10-CM

## 2011-08-14 MED ORDER — GLIMEPIRIDE 2 MG PO TABS: 3.0000 mg | ORAL_TABLET | ORAL | Status: DC

## 2011-08-14 MED ORDER — PRODIGY TWIST TOP LANCETS 28G MISC: Status: AC

## 2011-08-14 MED ORDER — GLUCOSE BLOOD VI STRP: ORAL_STRIP | Status: AC

## 2011-08-14 NOTE — Assessment & Plan Note (Signed)
CBGs > 200 while on Januvia, self restart amaryl and CBGs now ~150 Told pt I already referred him to endocrinology and further advise needs to came from them Pt likes a referral to Dr Allena Katz at [facility]: will do Will RF  amaryl for now

## 2011-08-14 NOTE — Progress Notes (Signed)
  Subjective:    Patient ID: Allen Gomez, male    DOB: May 06, 1936, 75 y.o.   MRN: [id number]  HPI    Review of Systems     Objective:   Physical Exam        Assessment & Plan:

## 2011-08-14 NOTE — Progress Notes (Signed)
  Subjective:    Patient ID: Allen Gomez, male    DOB: [birth year], 75 y.o.   MRN: [id number]  HPI Saw endocrinology, recommended to take januvia only , CBGs were high, re started amaryl, see a/p Needs Rx for glucometer strops for his new machine Several other issues: should I take testosterone? (no unless he has a documented dx of  hypogonadism ) Found a website "cure DM in 30 days": advise to stick with his endocrinologist advise   Past Medical History  Diagnosis Date  . Diabetes mellitus   . Hyperlipidemia   . Hypertension   . BPH (benign prostatic hypertrophy)   . Allergic rhinitis       Review of Systems Saw foot specialist, better     Objective:   Physical Exam  Constitutional: He is oriented to person, place, and time. He appears well-developed and well-nourished. No distress.  Neurological: He is alert and oriented to person, place, and time.  Skin: He is not diaphoretic.          Assessment & Plan:

## 2011-10-28 ENCOUNTER — Ambulatory Visit: Payer: Medicare Other | Admitting: Endocrinology

## 2011-12-29 ENCOUNTER — Other Ambulatory Visit: Payer: Self-pay | Admitting: Internal Medicine

## 2011-12-30 NOTE — Telephone Encounter (Signed)
Refill done.  

## 2011-12-31 ENCOUNTER — Other Ambulatory Visit: Payer: Self-pay | Admitting: Internal Medicine

## 2011-12-31 NOTE — Telephone Encounter (Signed)
Refill done.  

## 2012-01-27 ENCOUNTER — Ambulatory Visit (INDEPENDENT_AMBULATORY_CARE_PROVIDER_SITE_OTHER): Payer: Medicare Other | Admitting: Internal Medicine

## 2012-01-27 ENCOUNTER — Encounter: Payer: Self-pay | Admitting: Internal Medicine

## 2012-01-27 VITALS — BP 128/76 | HR 72 | Temp 98.3°F | Wt 219.0 lb

## 2012-01-27 DIAGNOSIS — N400 Enlarged prostate without lower urinary tract symptoms: Secondary | ICD-10-CM

## 2012-01-27 DIAGNOSIS — E785 Hyperlipidemia, unspecified: Secondary | ICD-10-CM

## 2012-01-27 DIAGNOSIS — E119 Type 2 diabetes mellitus without complications: Secondary | ICD-10-CM

## 2012-01-27 DIAGNOSIS — I10 Essential (primary) hypertension: Secondary | ICD-10-CM

## 2012-01-27 DIAGNOSIS — L538 Other specified erythematous conditions: Secondary | ICD-10-CM

## 2012-01-27 LAB — BASIC METABOLIC PANEL
BUN: 13 mg/dL (ref 6–23)
Chloride: 105 mEq/L (ref 96–112)
GFR: 106.72 mL/min (ref >60.00)
Potassium: 4.7 mEq/L (ref 3.5–5.1)
Sodium: 139 mEq/L (ref 135–145)

## 2012-01-27 LAB — LIPID PANEL
Cholesterol: 226 mg/dL — ABNORMAL HIGH (ref 0–200)
HDL: 37.6 mg/dL — ABNORMAL LOW (ref >39.00)
Total CHOL/HDL Ratio: 6
VLDL: 40 mg/dL (ref 0.0–40.0)

## 2012-01-27 MED ORDER — TAMSULOSIN HCL 0.4 MG PO CAPS: 0.4000 mg | ORAL_CAPSULE | Freq: Every day | ORAL | Status: DC

## 2012-01-27 MED ORDER — LOSARTAN POTASSIUM 100 MG PO TABS: 100.0000 mg | ORAL_TABLET | Freq: Every day | ORAL | Status: AC

## 2012-01-27 MED ORDER — NYSTATIN-TRIAMCINOLONE 100000-0.1 UNIT/GM-% EX OINT: TOPICAL_OINTMENT | Freq: Two times a day (BID) | CUTANEOUS | Status: DC

## 2012-01-27 NOTE — Assessment & Plan Note (Signed)
Well controlled, no amb BPs Labs

## 2012-01-27 NOTE — Patient Instructions (Signed)
If you like to checked in [place], you may like to ask for: A Cholesterol panel, kidney function, liver function, thyroid tests, PSA, testosterone level. See a dermatologist

## 2012-01-27 NOTE — Assessment & Plan Note (Addendum)
Prescribe Mycolog The patient is going to visit his country, [place], recommend to see dermatology there

## 2012-01-27 NOTE — Assessment & Plan Note (Signed)
On no meds, labs 

## 2012-01-27 NOTE — Assessment & Plan Note (Signed)
Last urology visit ([facility]) [phone]

## 2012-01-27 NOTE — Progress Notes (Signed)
  Subjective:    Patient ID: Allen Gomez, male    DOB: Dec 13, 1935, 76 y.o.   MRN: [id number]  HPI Routine office visit, in general doing well.   Past Medical History  Diagnosis Date  . Diabetes mellitus   . Hyperlipidemia   . Hypertension   . BPH (benign prostatic hypertrophy)   . Allergic rhinitis      Review of Systems Has a history of intertrigo. The skin @ the axillary areas and groins got darker  here lately. Usually he responds well to "vinegar". Denies any itching. Hypertension, good medication compliance, BP today normal, no ambulatory BPs Diabetes per endocrinology BPH not an issue at the present time.     Objective:   Physical Exam  Skin:      General -- alert, well-developed, and overweight appearing. No apparent distress.  Lungs -- normal respiratory effort, no intercostal retractions, no accessory muscle use, and normal breath sounds.   Heart-- normal rate, regular rhythm, no murmur, and no gallop.   extremities-- no pretibial edema bilaterally  skin-- hyperpigmented skin, macular, no scaliness. See graphic Psych-- Cognition and judgment appear intact. Alert and cooperative with normal attention span and concentration.  not anxious appearing and not depressed appearing.          Assessment & Plan:  Patient going to [place], he likes to get  " a good checkup there" asked me what labs need to be done. He also likes his testosterone check, I'm not opposed to that, has issues w/ ED. See instructions.

## 2012-01-27 NOTE — Assessment & Plan Note (Signed)
Per Dr. Patel

## 2012-01-30 MED ORDER — PRAVASTATIN SODIUM 40 MG PO TABS: 40.0000 mg | ORAL_TABLET | Freq: Every day | ORAL | Status: DC

## 2012-01-30 NOTE — Progress Notes (Signed)
Addended by: Edwena Felty T on: 01/30/2012 01:49 PM   Modules accepted: Orders

## 2012-02-05 ENCOUNTER — Encounter: Payer: Self-pay | Admitting: *Deleted

## 2012-02-18 ENCOUNTER — Other Ambulatory Visit: Payer: Self-pay | Admitting: Internal Medicine

## 2012-02-18 NOTE — Telephone Encounter (Signed)
Refill done.  

## 2012-02-26 ENCOUNTER — Other Ambulatory Visit: Payer: Self-pay | Admitting: Internal Medicine

## 2012-02-26 NOTE — Telephone Encounter (Signed)
Refill done.  

## 2012-04-30 ENCOUNTER — Ambulatory Visit (INDEPENDENT_AMBULATORY_CARE_PROVIDER_SITE_OTHER): Payer: Medicare Other | Admitting: Internal Medicine

## 2012-04-30 VITALS — BP 142/80 | HR 80 | Temp 99.0°F | Wt 218.0 lb

## 2012-04-30 DIAGNOSIS — E785 Hyperlipidemia, unspecified: Secondary | ICD-10-CM

## 2012-04-30 DIAGNOSIS — L538 Other specified erythematous conditions: Secondary | ICD-10-CM

## 2012-04-30 DIAGNOSIS — Z87898 Personal history of other specified conditions: Secondary | ICD-10-CM

## 2012-04-30 MED ORDER — KETOCONAZOLE 2 % EX CREA: TOPICAL_CREAM | Freq: Two times a day (BID) | CUTANEOUS | Status: DC

## 2012-04-30 MED ORDER — ROSUVASTATIN CALCIUM 5 MG PO TABS: 5.0000 mg | ORAL_TABLET | Freq: Every day | ORAL | Status: DC

## 2012-04-30 NOTE — Assessment & Plan Note (Signed)
C/o strong urine odor, recommen observation

## 2012-04-30 NOTE — Progress Notes (Signed)
  Subjective:    Patient ID: Allen Gomez, male    DOB: 01-15-1936, 76 y.o.   MRN: [id number]  HPI Here to discuss the following issues Several years history of "strong urine odor, only high doses of vitamin C make it better", reports that he has seen several urologists, urinalysis is always normal. He has a rash, was prescribed Mycolog, it may get worse. It occasionally itches. He was on Pravachol for high cholesterol, took it for a couple of months then stopped due to to aches and pains.   Past Medical History: Diabetes Mellitus Hyperlipidemia HTN BPH Allergic rhinitis   Past Surgical History: denies surgical history  Social History: from [place],  Divorced, 5 children Remote tobacco-none since 1984 (Smoked 2ppd for 20 years) No alcohol No illicit drugs Aerial surveyor-semi retired   Review of Systems Denies dysuria, gross hematuria. No foreskin irritation.     Objective:   Physical Exam  Constitutional: He appears well-developed. No distress.  Genitourinary:       Foreskin without swelling or discharge or redness  Musculoskeletal: He exhibits no edema.  Skin: He is not diaphoretic.           Assessment & Plan:

## 2012-04-30 NOTE — Patient Instructions (Addendum)
Use the cream as prescribed twice a day Will refer you to dermatology Try Crestor 5 mg one tablet daily for cholesterol. Come back in 3 months, fasting

## 2012-04-30 NOTE — Assessment & Plan Note (Signed)
Pravachol self discontinued due to aches and pains. He is willing to try a low dose of Crestor 5 mg one by mouth daily. Samples provided along with a prescription. Return to the office in 3 months

## 2012-04-30 NOTE — Assessment & Plan Note (Signed)
mycolog made rash better Plan;; Ketoconazole Derm referral

## 2012-05-16 ENCOUNTER — Other Ambulatory Visit: Payer: Self-pay | Admitting: Internal Medicine

## 2012-10-06 ENCOUNTER — Ambulatory Visit (INDEPENDENT_AMBULATORY_CARE_PROVIDER_SITE_OTHER): Payer: Medicare Other | Admitting: Internal Medicine

## 2012-10-06 VITALS — BP 148/86 | HR 83 | Temp 98.6°F | Wt 222.0 lb

## 2012-10-06 DIAGNOSIS — Z800 Family history of malignant neoplasm of digestive organs: Secondary | ICD-10-CM

## 2012-10-06 DIAGNOSIS — E785 Hyperlipidemia, unspecified: Secondary | ICD-10-CM

## 2012-10-06 DIAGNOSIS — Z87898 Personal history of other specified conditions: Secondary | ICD-10-CM

## 2012-10-06 DIAGNOSIS — I10 Essential (primary) hypertension: Secondary | ICD-10-CM

## 2012-10-06 LAB — LIPID PANEL
Cholesterol: 209 mg/dL — ABNORMAL HIGH (ref 0–200)
HDL: 34.6 mg/dL — ABNORMAL LOW (ref >39.00)
Total CHOL/HDL Ratio: 6
Triglycerides: 223 mg/dL — ABNORMAL HIGH (ref 0.0–149.0)
VLDL: 44.6 mg/dL — ABNORMAL HIGH (ref 0.0–40.0)

## 2012-10-06 LAB — BASIC METABOLIC PANEL
Chloride: 103 mEq/L (ref 96–112)
GFR: 93.61 mL/min (ref >60.00)
Potassium: 4 mEq/L (ref 3.5–5.1)
Sodium: 138 mEq/L (ref 135–145)

## 2012-10-06 LAB — ALT: ALT: 20 U/L (ref 0–53)

## 2012-10-06 LAB — AST: AST: 20 U/L (ref 0–37)

## 2012-10-06 NOTE — Progress Notes (Signed)
  Subjective:    Patient ID: Allen Gomez, male    DOB: 1936/04/11, 76 y.o.   MRN: [id number]  HPI Today we discussed the following issues Has a strong family history of pancreatic cancer, see assessment and plan. He is currently asymptomatic. High cholesterol, currently on Crestor, reports good compliance. Hypertension, good medication compliance, ambulatory BPs around 130 but he takes it rarely. BP today slightly elevated. BPH, good compliance with Flomax, continue with sporadic severe urinary urgency.  Past Medical History  Diagnosis Date  . Diabetes mellitus   . Hyperlipidemia   . Hypertension   . BPH (benign prostatic hypertrophy)   . Allergic rhinitis    Past Surgical History  Procedure Date  . No past surgeries    History   Social History  . Marital Status: Divorced    Spouse Name: N/A    Number of Children: 5  . Years of Education: N/A   Occupational History  . Scientist, physiological--- retired     Social History Main Topics  . Smoking status: Former Games developer  . Smokeless tobacco: Not on file     Comment: quit 30 years ago  . Alcohol Use: No  . Drug Use: No  . Sexually Active: Not on file   Other Topics Concern  . Not on file   Social History Narrative   From [place]    Review of Systems No nausea, vomiting, blood in the stools. No abdominal pain. Denies any fever weight loss. No dysuria or gross hematuria.     Objective:   Physical Exam General -- alert, well-developed Lungs -- normal respiratory effort, no intercostal retractions, no accessory muscle use, and normal breath sounds.   Heart-- normal rate, regular rhythm, no murmur, and no gallop.   Abdomen--soft, non-tender, no distention, no masses, no HSM, no guarding, and no rigidity.   Extremities-- no pretibial edema bilaterally  Psych-- Cognition and judgment appear intact. Alert and cooperative with normal attention span and concentration.  not anxious appearing and not depressed appearing.       Assessment & Plan:

## 2012-10-06 NOTE — Assessment & Plan Note (Signed)
Reports good compliance with medication, ambulatory BPs in the 140s. Plan: BMP, continue with same medicines

## 2012-10-06 NOTE — Assessment & Plan Note (Signed)
Last visit [facility] -- 2012. Continue with BPH symptoms. Recommend to discuss with them

## 2012-10-06 NOTE — Assessment & Plan Note (Signed)
Patient has a strong family history of pancreatic cancer colon Sister died at age 74 Aunt died at age 94 Uncle died at age 4. He is asymptomatic from a GI standpoint, I review the chart, I don't see any CT or abdominal ultrasound. Plan: Refer to a geneticist at the oncology division. May benefit from further eval.

## 2012-10-06 NOTE — Assessment & Plan Note (Signed)
Reports good compliance with medications, labs

## 2012-10-06 NOTE — Patient Instructions (Addendum)
Next visit in 4 months, fasting for a  physical exam

## 2012-10-08 ENCOUNTER — Encounter: Payer: Self-pay | Admitting: Internal Medicine

## 2012-10-09 ENCOUNTER — Encounter: Payer: Self-pay | Admitting: *Deleted

## 2012-10-09 ENCOUNTER — Telehealth: Payer: Self-pay | Admitting: Hematology & Oncology

## 2012-10-09 MED ORDER — ROSUVASTATIN CALCIUM 10 MG PO TABS: 10.0000 mg | ORAL_TABLET | Freq: Every day | ORAL | Status: DC

## 2012-10-09 NOTE — Addendum Note (Signed)
Addended by: Edwena Felty T on: 10/09/2012 02:58 PM   Modules accepted: Orders

## 2012-10-09 NOTE — Telephone Encounter (Signed)
Received referral from Dr. Drue Novel for genetics consult. Left Clydie Braun message to call me with procedure for a pt to get genetics counseling so I can call referring office

## 2012-12-06 ENCOUNTER — Other Ambulatory Visit: Payer: Self-pay | Admitting: Internal Medicine

## 2012-12-07 NOTE — Telephone Encounter (Signed)
Refill done.  

## 2013-02-12 ENCOUNTER — Ambulatory Visit (INDEPENDENT_AMBULATORY_CARE_PROVIDER_SITE_OTHER): Payer: Medicare Other | Admitting: Internal Medicine

## 2013-02-12 VITALS — BP 138/72 | HR 93 | Temp 98.3°F | Wt 221.0 lb

## 2013-02-12 DIAGNOSIS — J309 Allergic rhinitis, unspecified: Secondary | ICD-10-CM

## 2013-02-12 DIAGNOSIS — E785 Hyperlipidemia, unspecified: Secondary | ICD-10-CM

## 2013-02-12 DIAGNOSIS — Z800 Family history of malignant neoplasm of digestive organs: Secondary | ICD-10-CM

## 2013-02-12 DIAGNOSIS — I10 Essential (primary) hypertension: Secondary | ICD-10-CM

## 2013-02-12 MED ORDER — FLUTICASONE PROPIONATE 50 MCG/ACT NA SUSP: 2.0000 | Freq: Every day | NASAL | Status: DC

## 2013-02-12 MED ORDER — ROSUVASTATIN CALCIUM 10 MG PO TABS: 10.0000 mg | ORAL_TABLET | Freq: Every day | ORAL | Status: DC

## 2013-02-12 NOTE — Assessment & Plan Note (Signed)
Afraid of taking cholesterol meds, self d/c, s/e? Not clear , see HPI Reason to Rx statins discussed (decrease CV risk) rec to start crestor back, RTC 3 months fasting for a CPX

## 2013-02-12 NOTE — Assessment & Plan Note (Signed)
Seems well-controlled, no change 

## 2013-02-12 NOTE — Assessment & Plan Note (Addendum)
Reports persistent sx mostly nasal congestion in the mornings, Rx flonase

## 2013-02-12 NOTE — Progress Notes (Signed)
  Subjective:    Patient ID: Allen Gomez, male    DOB: December 22, 1935, 77 y.o.   MRN: [id number]  HPI Routine office visit Hypertension, good medication compliance, no recent ambulatory BPs,BP today is very good. High cholesterol, discontinue Crestor a while back, "afraid to take cholesterol medication", side effects? Patient not really clear about s/e, state he didn't feel very good but could not give me any details. Family history pancreatic cancer, never got to see a Dentist.  Past Medical History  Diagnosis Date  . Diabetes mellitus   . Hyperlipidemia   . Hypertension   . BPH (benign prostatic hypertrophy)   . Allergic rhinitis    Past Surgical History  Procedure Laterality Date  . No past surgeries       Review of Systems No dysuria or gross hematuria, no difficulty urinating, still reports a funny smell in the urine sometimes. No chest or shortness of breath     Objective:   Physical Exam BP 138/72  Pulse 93  Temp(Src) 98.3 F (36.8 C) (Oral)  Wt 221 lb (100.245 kg)  BMI 36.38 kg/m2  SpO2 96%  General -- alert, well-developed, No apparent distress HEENT -- TMs normal, throat w/o redness, face symmetric and not tender to palpation, Nose not congested Lungs -- normal respiratory effort, no intercostal retractions, no accessory muscle use, and normal breath sounds.   Heart-- normal rate, regular rhythm, no murmur, and no gallop.    Psych-- Cognition and judgment appear intact. Alert and cooperative with normal attention span and concentration.  not anxious appearing and not depressed appearing.       Assessment & Plan:

## 2013-02-12 NOTE — Assessment & Plan Note (Addendum)
Never got to see a counselor: re refer (message sent to our scheduler)

## 2013-02-12 NOTE — Patient Instructions (Signed)
Next visit in 3 months, fasting for a physical exam 

## 2013-02-14 ENCOUNTER — Encounter: Payer: Self-pay | Admitting: Internal Medicine

## 2013-02-26 ENCOUNTER — Telehealth: Payer: Self-pay | Admitting: Genetic Counselor

## 2013-02-26 NOTE — Telephone Encounter (Signed)
LVOM FOR PT TO RETURN CALL IN RE TO GENETIC APPT.  °

## 2013-03-16 ENCOUNTER — Ambulatory Visit (HOSPITAL_BASED_OUTPATIENT_CLINIC_OR_DEPARTMENT_OTHER): Payer: Medicare Other | Admitting: Genetic Counselor

## 2013-03-16 ENCOUNTER — Encounter: Payer: Self-pay | Admitting: Genetic Counselor

## 2013-03-16 DIAGNOSIS — Z8049 Family history of malignant neoplasm of other genital organs: Secondary | ICD-10-CM

## 2013-03-16 DIAGNOSIS — Z800 Family history of malignant neoplasm of digestive organs: Secondary | ICD-10-CM

## 2013-03-16 NOTE — Progress Notes (Signed)
Dr.  Willow Ora requested a consultation for genetic counseling and risk assessment for [facility], a 77 y.o. male, for discussion of his family history of pancreatic and endometrial cancer. He presents to clinic today to discuss the possibility of a genetic predisposition to cancer, and to further clarify his risks, as well as his family members' risks for cancer.   HISTORY OF PRESENT ILLNESS: Allen Gomez is a 77 y.o. male with no personal history of cancer.  He is a former smoker.  He reports that he has had two colonoscopies, one at the [facility] in [place], [place] that was normal.  The other was here in [place] in 2010.  According to those records, he had two tuberous adenoma's removed from the ascending colon.  Past Medical History  Diagnosis Date  . Diabetes mellitus   . Hyperlipidemia   . Hypertension   . BPH (benign prostatic hypertrophy)   . Allergic rhinitis     Past Surgical History  Procedure Laterality Date  . No past surgeries      History  Substance Use Topics  . Smoking status: Former Games developer  . Smokeless tobacco: Not on file     Comment: quit 30 years ago  . Alcohol Use: No    RISK ASSESSMENT FACTORS: Former Smoker Colonoscopy: two Polyps: 2 tuberous adenoma's on his last colonoscopy    FAMILY HISTORY:  We obtained a detailed, 4-generation family history.  Significant diagnoses are listed below: Family History  Problem Relation Age of Onset  . Colon cancer Neg Hx   . Prostate cancer Neg Hx   . Diabetes Neg Hx   . Heart attack      uncle age 58  . Stroke Mother 25  . Pancreatic cancer Sister 78    maternal 1/2 sister  . Pancreatic cancer Maternal Aunt     dx and died in his 42s  . Endometrial cancer Daughter 99    Patient's maternal ancestors are of [place] descent, and paternal ancestors are of [place] descent. There is no reported Ashkenazi Jewish ancestry. There is no known consanguinity.  GENETIC COUNSELING ASSESSMENT: [name] is  a 77 y.o. male with a personal history of two colon polyps and family history of pancreatic and endometrial cancer which somewhat suggestive of a hereditary cancer syndrome and predisposition to cancer. We, therefore, discussed and recommended the following at today's visit.   DISCUSSION: We reviewed the characteristics, features and inheritance patterns of hereditary cancer syndromes. We also discussed genetic testing, including the appropriate family members to test, the process of testing, insurance coverage and turn-around-time for results. We discussed that testing someone in the family who has cancer is the most informative for the family and is the most likely way to get insurance coverage for testing.  Medicare will not cover Mr. Spickard's testing as he is not currently affected with cancer.  Based on his family history, Mr. Meggett daughter would be the most informative person to test based on her early onset of endometrial cancer. We discussed that endometrial cancer and pancreatic cancer is most easily related through the hereditary syndrome of Lynch syndrome, although he does not have a family history of colon cancer, another hallmark cancer in that syndrome.  Depending on his daughter's cancer type, and any type of genetic testing that may have been performed on her to date, we could consider genetic testing for uterine and pancreatic cancer syndromes through the Comprehensive Cancer Panel at [facility].  PLAN: After  considering the risks, benefits, and limitations, VENTURA HOLLENBECK declined testing and will have his daughter call and set up an appointment. We discussed the implications of a positive, negative and/ or variant of uncertain significance genetic test result. This information will also be available in Epic. Lejon Afzal Hollister's questions were answered to his satisfaction today. Our contact information was provided should additional questions or concerns arise.  The patient was seen for a total of 60  minutes, greater than 50% of which was spent face-to-face counseling.  This plan is being carried out per Dr. Feliz Beam recommendations.  This note will also be sent to the referring provider via the electronic medical record. The patient will be supplied with a summary of this genetic counseling discussion as well as educational information on the discussed hereditary cancer syndromes following the conclusion of their visit.   Patient was discussed with Dr. Drue Second.   _______________________________________________________________________ For Office Staff:  Number of people involved in session: 1 Was an Intern/ student involved with case: no

## 2013-04-15 ENCOUNTER — Other Ambulatory Visit: Payer: Medicare Other | Admitting: Lab

## 2013-04-15 ENCOUNTER — Encounter: Payer: Medicare Other | Admitting: Genetic Counselor

## 2013-05-03 ENCOUNTER — Other Ambulatory Visit: Payer: Self-pay | Admitting: Internal Medicine

## 2013-05-04 NOTE — Telephone Encounter (Signed)
Advise pharmacy, all diabetes mellitus meds need to be RF by endocrinology

## 2013-05-04 NOTE — Telephone Encounter (Signed)
Ok to refill? Last OV 5.19.14 Last A1c 9.12.12 Last filled per our records was 08/14/2011  According to listed pharmacy last filled 1.26.14 for one month supply, pt. States he does use this pharmacy.  Spoke with patient, he states he does have an endocrinologist but did not know his name. He also could not tell me who has been prescribing the medication nor if he has been taking it regularly due to a language barrier. Please advise.

## 2013-05-05 NOTE — Telephone Encounter (Signed)
Pharmacy made aware of refill denial and reason. Verbalized understanding.

## 2013-07-13 ENCOUNTER — Telehealth: Payer: Self-pay | Admitting: Internal Medicine

## 2013-07-13 NOTE — Telephone Encounter (Signed)
Patient Information:  Caller Name: Adel  Phone: [phone]  Patient: Allen Gomez, Allen Gomez  Gender: Male  DOB: 06/21/1936  Age: 77 Years  PCP: Willow Ora  Office Follow Up:  Does the office need to follow up with this patient?: No  Instructions For The Office: N/A   Symptoms  Reason For Call & Symptoms: Pt is calling with pressure on his chest. Onset yesterday. Pressure is continuous.  Pt does not have a cough/cold sx.  Reviewed Health History In EMR: Yes  Reviewed Medications In EMR: Yes  Reviewed Allergies In EMR: Yes  Reviewed Surgeries / Procedures: Yes  Date of Onset of Symptoms: 07/12/2013  Guideline(s) Used:  Chest Pain  Disposition Per Guideline:   Call EMS 911 Now  Reason For Disposition Reached:   Chest pain lasting longer than 5 minutes and ANY of the following:  Over 34 years old Over 82 years old and at least one cardiac risk factor (i.e., high blood pressure, diabetes, high cholesterol, obesity, smoker or strong family history of heart disease) Pain is crushing, pressure-like, or heavy  Took nitroglycerin and chest pain was not relieved History of heart disease (i.e., angina, heart attack, bypass surgery, angioplasty, CHF)  Advice Given:  N/A  Patient Will Follow Care Advice:  YES

## 2013-08-05 ENCOUNTER — Telehealth: Payer: Self-pay

## 2013-08-05 NOTE — Telephone Encounter (Addendum)
Left message for call back Non identifiable  Medication List and allergies: reviewed and updated  90 day supply/mail order: na Local prescriptions: [facility] [address]  Immunizations due: Flu, Shingles  A/P:   No PSH or FH changes Tdap 02/2007 Followed by Urology--[facility]--last OV 05/2011 Endo--Dr Patel--[facility]--last OV 07/2013 Eye Exam---Dr Hecker--last OV 02/2012 [facility]---09/2009---Benigh Polyps--next due 2015--Dr Marina Goodell  To Discuss with Provider: Stopped taking Flomax 2 weeks ago---"dosen't work" Sharp pain in back of head near occipital x several days--using ibuprofen which elevates pain New Rx for Nitro--out of date Rx--last filled 2004

## 2013-08-06 ENCOUNTER — Ambulatory Visit (INDEPENDENT_AMBULATORY_CARE_PROVIDER_SITE_OTHER): Payer: Medicare Other | Admitting: Internal Medicine

## 2013-08-06 ENCOUNTER — Encounter: Payer: Self-pay | Admitting: Internal Medicine

## 2013-08-06 VITALS — BP 149/75 | HR 83 | Temp 99.2°F | Ht 66.0 in | Wt 220.0 lb

## 2013-08-06 DIAGNOSIS — Z0000 Encounter for general adult medical examination without abnormal findings: Secondary | ICD-10-CM

## 2013-08-06 DIAGNOSIS — Z800 Family history of malignant neoplasm of digestive organs: Secondary | ICD-10-CM

## 2013-08-06 DIAGNOSIS — I10 Essential (primary) hypertension: Secondary | ICD-10-CM

## 2013-08-06 DIAGNOSIS — Z23 Encounter for immunization: Secondary | ICD-10-CM

## 2013-08-06 DIAGNOSIS — Z87898 Personal history of other specified conditions: Secondary | ICD-10-CM

## 2013-08-06 DIAGNOSIS — E785 Hyperlipidemia, unspecified: Secondary | ICD-10-CM

## 2013-08-06 LAB — BASIC METABOLIC PANEL
BUN: 15 mg/dL (ref 6–23)
Chloride: 102 mEq/L (ref 96–112)
Glucose, Bld: 148 mg/dL — ABNORMAL HIGH (ref 70–99)
Potassium: 4.1 mEq/L (ref 3.5–5.1)
Sodium: 138 mEq/L (ref 135–145)

## 2013-08-06 LAB — CBC WITH DIFFERENTIAL/PLATELET
Basophils Absolute: 0.1 10*3/uL (ref 0.0–0.1)
Eosinophils Absolute: 0.4 10*3/uL (ref 0.0–0.7)
Eosinophils Relative: 3.8 % (ref 0.0–5.0)
HCT: 40 % (ref 39.0–52.0)
Lymphs Abs: 4 10*3/uL (ref 0.7–4.0)
MCV: 89.6 fl (ref 78.0–100.0)
Monocytes Absolute: 0.6 10*3/uL (ref 0.1–1.0)
Neutrophils Relative %: 46.1 % (ref 43.0–77.0)
Platelets: 261 10*3/uL (ref 150.0–400.0)
RDW: 13.9 % (ref 11.5–14.6)
WBC: 9.2 10*3/uL (ref 4.5–10.5)

## 2013-08-06 LAB — LIPID PANEL
Cholesterol: 221 mg/dL — ABNORMAL HIGH (ref 0–200)
HDL: 39.3 mg/dL (ref >39.00)
Triglycerides: 154 mg/dL — ABNORMAL HIGH (ref 0.0–149.0)

## 2013-08-06 LAB — AST: AST: 16 U/L (ref 0–37)

## 2013-08-06 LAB — LDL CHOLESTEROL, DIRECT: Direct LDL: 167.2 mg/dL

## 2013-08-06 MED ORDER — NITROGLYCERIN 0.4 MG SL SUBL: 0.4000 mg | SUBLINGUAL_TABLET | SUBLINGUAL | Status: AC | PRN

## 2013-08-06 MED ORDER — ZOSTER VACCINE LIVE 19400 UNT/0.65ML ~~LOC~~ SOLR: 0.6500 mL | Freq: Once | SUBCUTANEOUS | Status: AC

## 2013-08-06 MED ORDER — AMLODIPINE BESYLATE 5 MG PO TABS: 5.0000 mg | ORAL_TABLET | Freq: Every day | ORAL | Status: AC

## 2013-08-06 NOTE — Assessment & Plan Note (Addendum)
17 days ago went to see Dr. Allena Katz because his BP was elevated, he recommend  To start  amlodipine which he is taking without apparent side effects, the patient also stop losartan, unclear to me if that was a recommendation from Dr. Allena Katz. Plan: BMP Restart losartan BMP in 2 weeks Self BP check. Likes to check BP here, rec to RTC 1xweek, no charge

## 2013-08-06 NOTE — Assessment & Plan Note (Signed)
Labs , Further advice w/ results

## 2013-08-06 NOTE — Patient Instructions (Signed)
Get your blood work before you leave   Schedule a lab appointment for a BMP 3 weeks from now (BMP hypertension)  Next visit in 3 months   for a   hypertension  follow up . No Fasting   Take the medication as prescribed Check the  blood pressure 2 or 3 times a   week be sure it is between 110/60 and 140/85. Ideal blood pressure is 120/80. If it is consistently higher or lower, let me know     Fall Prevention and Home Safety Falls cause injuries and can affect all age groups. It is possible to use preventive measures to significantly decrease the likelihood of falls. There are many simple measures which can make your home safer and prevent falls. OUTDOORS  Repair cracks and edges of walkways and driveways.  Remove high doorway thresholds.  Trim shrubbery on the main path into your home.  Have good outside lighting.  Clear walkways of tools, rocks, debris, and clutter.  Check that handrails are not broken and are securely fastened. Both sides of steps should have handrails.  Have leaves, snow, and ice cleared regularly.  Use sand or salt on walkways during winter months.  In the garage, clean up grease or oil spills. BATHROOM  Install night lights.  Install grab bars by the toilet and in the tub and shower.  Use non-skid mats or decals in the tub or shower.  Place a plastic non-slip stool in the shower to sit on, if needed.  Keep floors dry and clean up all water on the floor immediately.  Remove soap buildup in the tub or shower on a regular basis.  Secure bath mats with non-slip, double-sided rug tape.  Remove throw rugs and tripping hazards from the floors. BEDROOMS  Install night lights.  Make sure a bedside light is easy to reach.  Do not use oversized bedding.  Keep a telephone by your bedside.  Have a firm chair with side arms to use for getting dressed.  Remove throw rugs and tripping hazards from the floor. KITCHEN  Keep handles on pots and pans  turned toward the center of the stove. Use back burners when possible.  Clean up spills quickly and allow time for drying.  Avoid walking on wet floors.  Avoid hot utensils and knives.  Position shelves so they are not too high or low.  Place commonly used objects within easy reach.  If necessary, use a sturdy step stool with a grab bar when reaching.  Keep electrical cables out of the way.  Do not use floor polish or wax that makes floors slippery. If you must use wax, use non-skid floor wax.  Remove throw rugs and tripping hazards from the floor. STAIRWAYS  Never leave objects on stairs.  Place handrails on both sides of stairways and use them. Fix any loose handrails. Make sure handrails on both sides of the stairways are as long as the stairs.  Check carpeting to make sure it is firmly attached along stairs. Make repairs to worn or loose carpet promptly.  Avoid placing throw rugs at the top or bottom of stairways, or properly secure the rug with carpet tape to prevent slippage. Get rid of throw rugs, if possible.  Have an electrician put in a light switch at the top and bottom of the stairs. OTHER FALL PREVENTION TIPS  Wear low-heel or rubber-soled shoes that are supportive and fit well. Wear closed toe shoes.  When using a stepladder, make sure  it is fully opened and both spreaders are firmly locked. Do not climb a closed stepladder.  Add color or contrast paint or tape to grab bars and handrails in your home. Place contrasting color strips on first and last steps.  Learn and use mobility aids as needed. Install an electrical emergency response system.  Turn on lights to avoid dark areas. Replace light bulbs that burn out immediately. Get light switches that glow.  Arrange furniture to create clear pathways. Keep furniture in the same place.  Firmly attach carpet with non-skid or double-sided tape.  Eliminate uneven floor surfaces.  Select a carpet pattern that  does not visually hide the edge of steps.  Be aware of all pets. OTHER HOME SAFETY TIPS  Set the water temperature for 120 F (48.8 C).  Keep emergency numbers on or near the telephone.  Keep smoke detectors on every level of the home and near sleeping areas. Document Released: 09/13/2002 Document Revised: 03/24/2012 Document Reviewed: 12/13/2011 [facility] Patient Information 2014 [place], [place].

## 2013-08-06 NOTE — Assessment & Plan Note (Signed)
Td 2008 Flu shot-- today shingles  Shot, requests Rx-- done  PNM shot completed   CCS---09/2009---Benigh Polyps--next due 2015--Dr Marina Goodell PSAs-- will be refer to urology Diet-exercise discussed

## 2013-08-06 NOTE — Assessment & Plan Note (Signed)
See history of present illness, request a referral to urology in [place]. Will arrange

## 2013-08-06 NOTE — Assessment & Plan Note (Addendum)
Status post eval by one of the geneticists, see note

## 2013-08-06 NOTE — Progress Notes (Signed)
Subjective:    Patient ID: Allen Gomez, male    DOB: [birth year], 77 y.o.   MRN: [id number]  HPI   Here for Medicare AWV: 1. Risk factors based on Past M, S, F history: reviewed 2. Physical Activities: little exercise   3. Depression/mood: neg screening  4. Hearing:  No problemss noted or reported  5. ADL's:  Independent, drives  6. Fall Risk: no recent falls  7. home Safety: does feel safe at home  8. Height, weight, & visual acuity: see VS, has eye checks yearly, no problems reported  9. Counseling: provided 10. Labs ordered based on risk factors: if needed  11. Referral Coordination: if needed 12. Care Plan, see assessment and plan  13. Cognitive Assessment: motor and cognitive skills wnl for age  In addition, today we discussed the following: BPH--Stopped taking Flomax 2 weeks ago---"dosen't work" , request a urology referral Sharp pain in back of head near occipital x several days--using ibuprofen   Needs a RF forfor Nitro--out of date Rx--last filled 2004 Hypertension--17 days ago went to see Dr. Allena Katz because his BP was elevated, he recommend  to start  amlodipine which he is taking without apparent side effects, the patient also stop losartan, unclear to me if that was a recommendation from Dr. Allena Katz.   Past Medical History  Diagnosis Date  . Diabetes mellitus   . Hyperlipidemia   . Hypertension   . BPH (benign prostatic hypertrophy)   . Allergic rhinitis    Past Surgical History  Procedure Laterality Date  . No past surgeries     History   Social History  . Marital Status: Divorced    Spouse Name: N/A    Number of Children: 5  . Years of Education: N/A   Occupational History  . Scientist, physiological--- retired    .     Social History Main Topics  . Smoking status: Former Games developer  . Smokeless tobacco: Never Used     Comment: quit 1984 (2 ppd x 20 years)  . Alcohol Use: No  . Drug Use: No  . Sexual Activity: Not on file   Other Topics Concern  . Not on  file   Social History Narrative   From [place], household pt and 1 of his daughters            Family History  Problem Relation Age of Onset  . Colon cancer Neg Hx   . Prostate cancer Neg Hx   . Diabetes Neg Hx   . Heart attack Other     uncle age 23  . Stroke Mother 75  . Pancreatic cancer Sister 76    maternal 1/2 sister  . Pancreatic cancer Maternal Aunt     dx and died in his 57s  . Endometrial cancer Daughter 24    Review of Systems No  CP, SOB, lower extremity edema. Few days ago he did have a generalized ache from the suprapubic area to the neck after a large meal, symptoms lasted a few hours and then resolved. Denies  nausea, vomiting diarrhea Denies  blood in the stools (-) cough, sputum production. (-) wheezing  No dysuria, gross hematuria      Objective:   Physical Exam BP 149/75  Pulse 83  Temp(Src) 99.2 F (37.3 C)  Ht 5\' 6"  (1.676 m)  Wt 220 lb (99.791 kg)  BMI 35.53 kg/m2  SpO2 97% General -- alert, well-developed, NAD.  Neck --no thyromegaly , normal carotid pulse Lungs --  normal respiratory effort, no intercostal retractions, no accessory muscle use, and normal breath sounds.  Heart-- normal rate, regular rhythm, no murmur.  Abdomen-- Not distended, good bowel sounds,soft, non-tender. Extremities-- no pretibial edema bilaterally  Neurologic--  alert & oriented X3. Speech normal, gait normal, strength normal in all extremities.  Psych-- Cognition and judgment appear intact. Cooperative with normal attention span and concentration. No anxious appearing , no depressed appearing.      Assessment & Plan:  Neck pain-- rec d/c NSAIDs

## 2013-08-09 ENCOUNTER — Telehealth: Payer: Self-pay | Admitting: *Deleted

## 2013-08-09 DIAGNOSIS — E785 Hyperlipidemia, unspecified: Secondary | ICD-10-CM

## 2013-08-09 MED ORDER — ROSUVASTATIN CALCIUM 10 MG PO TABS: 20.0000 mg | ORAL_TABLET | Freq: Every day | ORAL | Status: AC

## 2013-08-09 NOTE — Telephone Encounter (Signed)
Message copied by Eustace Quail on Mon Aug 09, 2013 11:29 AM ------      Message from: Wanda Plump      Created: Sat Aug 07, 2013  3:55 PM       Call patient:      All labs are satisfactory except for his cholesterol but continued to be elevated      Patient reportedly takes Crestor 10 mg daily, recommend to increase Crestor to 20 mg daily, send a new prescription, We'll recheck his labs in 3 months when he comes back for a routine followup ------

## 2013-08-09 NOTE — Telephone Encounter (Signed)
Pt notified. New rx refilled. DJR

## 2013-08-24 ENCOUNTER — Other Ambulatory Visit (INDEPENDENT_AMBULATORY_CARE_PROVIDER_SITE_OTHER): Payer: Medicare Other

## 2013-08-24 ENCOUNTER — Telehealth: Payer: Self-pay | Admitting: *Deleted

## 2013-08-24 DIAGNOSIS — I10 Essential (primary) hypertension: Secondary | ICD-10-CM

## 2013-08-24 NOTE — Telephone Encounter (Signed)
Patient came in the office today as a walk-in for blood pressure check. Patient blood pressure was 160/80. Patient states that has not taken his medication this morning due to him having to come in office and get lab work done.    Per Dr. Drue Novel patient is to continue to check his blood pressure 2 times a week, continue his medication and notify off if blood pressure is higher than 140/85 consistently . Patient understood. SW

## 2013-08-25 ENCOUNTER — Other Ambulatory Visit: Payer: Medicare Other

## 2013-08-25 LAB — BASIC METABOLIC PANEL
BUN: 11 mg/dL (ref 6–23)
Calcium: 9.1 mg/dL (ref 8.4–10.5)
Chloride: 100 mEq/L (ref 96–112)
Creatinine, Ser: 0.7 mg/dL (ref 0.4–1.5)
GFR: 111.32 mL/min (ref >60.00)
Potassium: 4.1 mEq/L (ref 3.5–5.1)

## 2013-08-26 ENCOUNTER — Encounter: Payer: Self-pay | Admitting: *Deleted

## 2014-04-21 ENCOUNTER — Telehealth: Payer: Self-pay

## 2014-04-21 NOTE — Telephone Encounter (Signed)
Diabetic Bundle:  Pt states he is no longer seeing Dr Drue NovelPaz. Pt states he is going to a dr closer to his house

## 2014-05-19 ENCOUNTER — Encounter: Payer: Self-pay | Admitting: Internal Medicine

## 2014-05-26 ENCOUNTER — Telehealth: Payer: Self-pay | Admitting: Internal Medicine

## 2014-05-26 NOTE — Telephone Encounter (Signed)
Called patient to schedule cpe.  Patient notified me that he has switched providers.

## 2014-07-20 ENCOUNTER — Encounter: Payer: Self-pay | Admitting: Internal Medicine

## 2015-01-10 ENCOUNTER — Encounter: Payer: Self-pay | Admitting: Internal Medicine

## 2015-10-24 DIAGNOSIS — I10 Essential (primary) hypertension: Secondary | ICD-10-CM | POA: Diagnosis not present

## 2015-10-24 DIAGNOSIS — E785 Hyperlipidemia, unspecified: Secondary | ICD-10-CM | POA: Diagnosis not present

## 2015-10-24 DIAGNOSIS — R809 Proteinuria, unspecified: Secondary | ICD-10-CM | POA: Diagnosis not present

## 2015-10-24 DIAGNOSIS — E1129 Type 2 diabetes mellitus with other diabetic kidney complication: Secondary | ICD-10-CM | POA: Diagnosis not present

## 2015-10-24 DIAGNOSIS — M65311 Trigger thumb, right thumb: Secondary | ICD-10-CM | POA: Diagnosis not present

## 2015-11-21 DIAGNOSIS — R809 Proteinuria, unspecified: Secondary | ICD-10-CM | POA: Diagnosis not present

## 2015-11-21 DIAGNOSIS — E1129 Type 2 diabetes mellitus with other diabetic kidney complication: Secondary | ICD-10-CM | POA: Diagnosis not present

## 2015-11-21 DIAGNOSIS — I10 Essential (primary) hypertension: Secondary | ICD-10-CM | POA: Diagnosis not present

## 2015-12-04 DIAGNOSIS — E1129 Type 2 diabetes mellitus with other diabetic kidney complication: Secondary | ICD-10-CM | POA: Diagnosis not present

## 2015-12-04 DIAGNOSIS — R809 Proteinuria, unspecified: Secondary | ICD-10-CM | POA: Diagnosis not present

## 2015-12-04 DIAGNOSIS — E1165 Type 2 diabetes mellitus with hyperglycemia: Secondary | ICD-10-CM | POA: Diagnosis not present

## 2015-12-30 DIAGNOSIS — R509 Fever, unspecified: Secondary | ICD-10-CM | POA: Diagnosis not present

## 2015-12-30 DIAGNOSIS — J101 Influenza due to other identified influenza virus with other respiratory manifestations: Secondary | ICD-10-CM | POA: Diagnosis not present

## 2016-01-07 DIAGNOSIS — J101 Influenza due to other identified influenza virus with other respiratory manifestations: Secondary | ICD-10-CM | POA: Diagnosis not present

## 2016-01-07 DIAGNOSIS — R05 Cough: Secondary | ICD-10-CM | POA: Diagnosis not present

## 2016-01-07 DIAGNOSIS — R0602 Shortness of breath: Secondary | ICD-10-CM | POA: Diagnosis not present

## 2016-01-07 DIAGNOSIS — R062 Wheezing: Secondary | ICD-10-CM | POA: Diagnosis not present

## 2016-01-15 DIAGNOSIS — J189 Pneumonia, unspecified organism: Secondary | ICD-10-CM | POA: Diagnosis not present

## 2016-03-04 DIAGNOSIS — R809 Proteinuria, unspecified: Secondary | ICD-10-CM | POA: Diagnosis not present

## 2016-03-04 DIAGNOSIS — E1165 Type 2 diabetes mellitus with hyperglycemia: Secondary | ICD-10-CM | POA: Diagnosis not present

## 2016-03-04 DIAGNOSIS — I10 Essential (primary) hypertension: Secondary | ICD-10-CM | POA: Diagnosis not present

## 2016-03-04 DIAGNOSIS — E785 Hyperlipidemia, unspecified: Secondary | ICD-10-CM | POA: Diagnosis not present

## 2016-03-04 DIAGNOSIS — E1129 Type 2 diabetes mellitus with other diabetic kidney complication: Secondary | ICD-10-CM | POA: Diagnosis not present

## 2016-03-04 DIAGNOSIS — R05 Cough: Secondary | ICD-10-CM | POA: Diagnosis not present

## 2016-03-04 DIAGNOSIS — J189 Pneumonia, unspecified organism: Secondary | ICD-10-CM | POA: Diagnosis not present

## 2016-04-17 DIAGNOSIS — H353122 Nonexudative age-related macular degeneration, left eye, intermediate dry stage: Secondary | ICD-10-CM | POA: Diagnosis not present

## 2016-04-17 DIAGNOSIS — E119 Type 2 diabetes mellitus without complications: Secondary | ICD-10-CM | POA: Diagnosis not present

## 2016-04-17 DIAGNOSIS — H353112 Nonexudative age-related macular degeneration, right eye, intermediate dry stage: Secondary | ICD-10-CM | POA: Diagnosis not present

## 2016-04-17 DIAGNOSIS — H40013 Open angle with borderline findings, low risk, bilateral: Secondary | ICD-10-CM | POA: Diagnosis not present

## 2016-04-17 DIAGNOSIS — H524 Presbyopia: Secondary | ICD-10-CM | POA: Diagnosis not present

## 2016-06-18 DIAGNOSIS — E669 Obesity, unspecified: Secondary | ICD-10-CM | POA: Diagnosis not present

## 2016-06-18 DIAGNOSIS — E1129 Type 2 diabetes mellitus with other diabetic kidney complication: Secondary | ICD-10-CM | POA: Diagnosis not present

## 2016-06-18 DIAGNOSIS — E785 Hyperlipidemia, unspecified: Secondary | ICD-10-CM | POA: Diagnosis not present

## 2016-06-18 DIAGNOSIS — I10 Essential (primary) hypertension: Secondary | ICD-10-CM | POA: Diagnosis not present

## 2016-06-18 DIAGNOSIS — L237 Allergic contact dermatitis due to plants, except food: Secondary | ICD-10-CM | POA: Diagnosis not present

## 2016-06-18 DIAGNOSIS — E1165 Type 2 diabetes mellitus with hyperglycemia: Secondary | ICD-10-CM | POA: Diagnosis not present

## 2016-06-18 DIAGNOSIS — R809 Proteinuria, unspecified: Secondary | ICD-10-CM | POA: Diagnosis not present

## 2016-06-19 DIAGNOSIS — D72829 Elevated white blood cell count, unspecified: Secondary | ICD-10-CM | POA: Diagnosis not present

## 2016-06-19 DIAGNOSIS — D649 Anemia, unspecified: Secondary | ICD-10-CM | POA: Diagnosis not present

## 2016-06-19 DIAGNOSIS — E538 Deficiency of other specified B group vitamins: Secondary | ICD-10-CM | POA: Diagnosis not present

## 2016-06-19 DIAGNOSIS — E1169 Type 2 diabetes mellitus with other specified complication: Secondary | ICD-10-CM | POA: Diagnosis not present

## 2016-06-19 DIAGNOSIS — E785 Hyperlipidemia, unspecified: Secondary | ICD-10-CM | POA: Diagnosis not present

## 2016-07-09 DIAGNOSIS — E1129 Type 2 diabetes mellitus with other diabetic kidney complication: Secondary | ICD-10-CM | POA: Diagnosis not present

## 2016-07-09 DIAGNOSIS — R509 Fever, unspecified: Secondary | ICD-10-CM | POA: Diagnosis not present

## 2016-07-09 DIAGNOSIS — E1165 Type 2 diabetes mellitus with hyperglycemia: Secondary | ICD-10-CM | POA: Diagnosis not present

## 2016-07-09 DIAGNOSIS — E785 Hyperlipidemia, unspecified: Secondary | ICD-10-CM | POA: Diagnosis not present

## 2016-07-09 DIAGNOSIS — J209 Acute bronchitis, unspecified: Secondary | ICD-10-CM | POA: Diagnosis not present

## 2016-07-09 DIAGNOSIS — R809 Proteinuria, unspecified: Secondary | ICD-10-CM | POA: Diagnosis not present

## 2016-10-17 DIAGNOSIS — E669 Obesity, unspecified: Secondary | ICD-10-CM | POA: Diagnosis not present

## 2016-10-17 DIAGNOSIS — R350 Frequency of micturition: Secondary | ICD-10-CM | POA: Diagnosis not present

## 2016-10-17 DIAGNOSIS — E1129 Type 2 diabetes mellitus with other diabetic kidney complication: Secondary | ICD-10-CM | POA: Diagnosis not present

## 2016-10-17 DIAGNOSIS — E1165 Type 2 diabetes mellitus with hyperglycemia: Secondary | ICD-10-CM | POA: Diagnosis not present

## 2016-10-17 DIAGNOSIS — E785 Hyperlipidemia, unspecified: Secondary | ICD-10-CM | POA: Diagnosis not present

## 2016-10-17 DIAGNOSIS — D519 Vitamin B12 deficiency anemia, unspecified: Secondary | ICD-10-CM | POA: Diagnosis not present

## 2016-10-17 DIAGNOSIS — R809 Proteinuria, unspecified: Secondary | ICD-10-CM | POA: Diagnosis not present

## 2016-10-17 DIAGNOSIS — I10 Essential (primary) hypertension: Secondary | ICD-10-CM | POA: Diagnosis not present

## 2016-10-25 DIAGNOSIS — H40013 Open angle with borderline findings, low risk, bilateral: Secondary | ICD-10-CM | POA: Diagnosis not present

## 2016-10-25 DIAGNOSIS — H04123 Dry eye syndrome of bilateral lacrimal glands: Secondary | ICD-10-CM | POA: Diagnosis not present

## 2016-11-10 DIAGNOSIS — S99911A Unspecified injury of right ankle, initial encounter: Secondary | ICD-10-CM | POA: Diagnosis not present

## 2016-11-10 DIAGNOSIS — W001XXA Fall from stairs and steps due to ice and snow, initial encounter: Secondary | ICD-10-CM | POA: Diagnosis not present

## 2016-11-10 DIAGNOSIS — S9304XA Dislocation of right ankle joint, initial encounter: Secondary | ICD-10-CM | POA: Diagnosis not present

## 2016-11-10 DIAGNOSIS — S82831A Other fracture of upper and lower end of right fibula, initial encounter for closed fracture: Secondary | ICD-10-CM | POA: Diagnosis not present

## 2016-11-10 DIAGNOSIS — I10 Essential (primary) hypertension: Secondary | ICD-10-CM | POA: Diagnosis not present

## 2016-11-10 DIAGNOSIS — S299XXA Unspecified injury of thorax, initial encounter: Secondary | ICD-10-CM | POA: Diagnosis not present

## 2016-11-10 DIAGNOSIS — Z7984 Long term (current) use of oral hypoglycemic drugs: Secondary | ICD-10-CM | POA: Diagnosis not present

## 2016-11-10 DIAGNOSIS — Z7982 Long term (current) use of aspirin: Secondary | ICD-10-CM | POA: Diagnosis not present

## 2016-11-10 DIAGNOSIS — T148XXA Other injury of unspecified body region, initial encounter: Secondary | ICD-10-CM | POA: Diagnosis not present

## 2016-11-10 DIAGNOSIS — S82301A Unspecified fracture of lower end of right tibia, initial encounter for closed fracture: Secondary | ICD-10-CM | POA: Diagnosis not present

## 2016-11-10 DIAGNOSIS — S82851A Displaced trimalleolar fracture of right lower leg, initial encounter for closed fracture: Secondary | ICD-10-CM | POA: Diagnosis not present

## 2016-11-10 DIAGNOSIS — E119 Type 2 diabetes mellitus without complications: Secondary | ICD-10-CM | POA: Diagnosis not present

## 2016-11-10 DIAGNOSIS — Z87891 Personal history of nicotine dependence: Secondary | ICD-10-CM | POA: Diagnosis not present

## 2016-11-12 DIAGNOSIS — S9304XA Dislocation of right ankle joint, initial encounter: Secondary | ICD-10-CM | POA: Diagnosis not present

## 2016-11-12 DIAGNOSIS — W19XXXA Unspecified fall, initial encounter: Secondary | ICD-10-CM | POA: Diagnosis not present

## 2016-11-13 DIAGNOSIS — S82891A Other fracture of right lower leg, initial encounter for closed fracture: Secondary | ICD-10-CM | POA: Diagnosis not present

## 2016-11-17 DIAGNOSIS — S9304XA Dislocation of right ankle joint, initial encounter: Secondary | ICD-10-CM | POA: Diagnosis not present

## 2016-11-18 DIAGNOSIS — S82891P Other fracture of right lower leg, subsequent encounter for closed fracture with malunion: Secondary | ICD-10-CM | POA: Diagnosis not present

## 2016-11-19 DIAGNOSIS — Z87891 Personal history of nicotine dependence: Secondary | ICD-10-CM | POA: Diagnosis not present

## 2016-11-19 DIAGNOSIS — S82851D Displaced trimalleolar fracture of right lower leg, subsequent encounter for closed fracture with routine healing: Secondary | ICD-10-CM | POA: Diagnosis not present

## 2016-11-19 DIAGNOSIS — I10 Essential (primary) hypertension: Secondary | ICD-10-CM | POA: Diagnosis not present

## 2016-11-19 DIAGNOSIS — E1140 Type 2 diabetes mellitus with diabetic neuropathy, unspecified: Secondary | ICD-10-CM | POA: Diagnosis not present

## 2016-11-19 DIAGNOSIS — Z7982 Long term (current) use of aspirin: Secondary | ICD-10-CM | POA: Diagnosis not present

## 2016-11-19 DIAGNOSIS — Z79899 Other long term (current) drug therapy: Secondary | ICD-10-CM | POA: Diagnosis not present

## 2016-11-19 DIAGNOSIS — Z9889 Other specified postprocedural states: Secondary | ICD-10-CM | POA: Diagnosis not present

## 2016-11-19 DIAGNOSIS — R9431 Abnormal electrocardiogram [ECG] [EKG]: Secondary | ICD-10-CM | POA: Diagnosis not present

## 2016-11-19 DIAGNOSIS — S82891A Other fracture of right lower leg, initial encounter for closed fracture: Secondary | ICD-10-CM | POA: Diagnosis not present

## 2016-11-19 DIAGNOSIS — W19XXXD Unspecified fall, subsequent encounter: Secondary | ICD-10-CM | POA: Diagnosis not present

## 2016-11-19 DIAGNOSIS — S82851A Displaced trimalleolar fracture of right lower leg, initial encounter for closed fracture: Secondary | ICD-10-CM | POA: Diagnosis not present

## 2016-11-21 DIAGNOSIS — S92909A Unspecified fracture of unspecified foot, initial encounter for closed fracture: Secondary | ICD-10-CM | POA: Diagnosis not present

## 2016-11-21 DIAGNOSIS — M79609 Pain in unspecified limb: Secondary | ICD-10-CM | POA: Diagnosis not present

## 2016-11-21 DIAGNOSIS — S82891D Other fracture of right lower leg, subsequent encounter for closed fracture with routine healing: Secondary | ICD-10-CM | POA: Diagnosis not present

## 2016-11-21 DIAGNOSIS — I10 Essential (primary) hypertension: Secondary | ICD-10-CM | POA: Diagnosis not present

## 2016-11-21 DIAGNOSIS — Z9181 History of falling: Secondary | ICD-10-CM | POA: Diagnosis not present

## 2016-11-21 DIAGNOSIS — E119 Type 2 diabetes mellitus without complications: Secondary | ICD-10-CM | POA: Diagnosis not present

## 2016-11-22 DIAGNOSIS — E119 Type 2 diabetes mellitus without complications: Secondary | ICD-10-CM | POA: Diagnosis not present

## 2016-11-22 DIAGNOSIS — I10 Essential (primary) hypertension: Secondary | ICD-10-CM | POA: Diagnosis not present

## 2016-11-22 DIAGNOSIS — R262 Difficulty in walking, not elsewhere classified: Secondary | ICD-10-CM | POA: Diagnosis not present

## 2016-11-22 DIAGNOSIS — S82851D Displaced trimalleolar fracture of right lower leg, subsequent encounter for closed fracture with routine healing: Secondary | ICD-10-CM | POA: Diagnosis not present

## 2016-11-22 DIAGNOSIS — Z9181 History of falling: Secondary | ICD-10-CM | POA: Diagnosis not present

## 2016-12-04 DIAGNOSIS — S82891D Other fracture of right lower leg, subsequent encounter for closed fracture with routine healing: Secondary | ICD-10-CM | POA: Diagnosis not present

## 2016-12-04 DIAGNOSIS — S82851A Displaced trimalleolar fracture of right lower leg, initial encounter for closed fracture: Secondary | ICD-10-CM | POA: Diagnosis not present

## 2016-12-04 DIAGNOSIS — S93431A Sprain of tibiofibular ligament of right ankle, initial encounter: Secondary | ICD-10-CM | POA: Diagnosis not present

## 2016-12-04 DIAGNOSIS — S9301XA Subluxation of right ankle joint, initial encounter: Secondary | ICD-10-CM | POA: Diagnosis not present

## 2016-12-05 DIAGNOSIS — I10 Essential (primary) hypertension: Secondary | ICD-10-CM | POA: Diagnosis not present

## 2016-12-05 DIAGNOSIS — Z9181 History of falling: Secondary | ICD-10-CM | POA: Diagnosis not present

## 2016-12-05 DIAGNOSIS — S82891D Other fracture of right lower leg, subsequent encounter for closed fracture with routine healing: Secondary | ICD-10-CM | POA: Diagnosis not present

## 2016-12-05 DIAGNOSIS — E119 Type 2 diabetes mellitus without complications: Secondary | ICD-10-CM | POA: Diagnosis not present

## 2016-12-10 ENCOUNTER — Other Ambulatory Visit: Payer: Self-pay | Admitting: *Deleted

## 2016-12-10 ENCOUNTER — Encounter: Payer: Self-pay | Admitting: *Deleted

## 2016-12-10 DIAGNOSIS — M62838 Other muscle spasm: Secondary | ICD-10-CM | POA: Diagnosis not present

## 2016-12-10 DIAGNOSIS — M25571 Pain in right ankle and joints of right foot: Secondary | ICD-10-CM | POA: Diagnosis not present

## 2016-12-10 DIAGNOSIS — R2689 Other abnormalities of gait and mobility: Secondary | ICD-10-CM | POA: Diagnosis not present

## 2016-12-10 NOTE — Patient Outreach (Signed)
[facility]N) Care Management  12/10/2016  Allen RidgeHugo R Gomez 07/21/1936 [id number]   CSW made an initial attempt to try and contact patient today to perform phone assessment, as well as assess and assist with social needs and services, without success.  A HIPAA complaint message was left for patient with [name] where patient currently resides to receive short-term rehabilitative services.  CSW is currently awaiting a return call.  CSW has requested that CSW colleagues, Allen Gomez and/or Allen Gomez, outreach to patient in CSW's absence from 5:00pm on Tuesday, March 6th through 8:30am on Monday, March 12th.  Otherwise, CSW will make arrangements to meet with patient for an initial visit [name] on Tuesday, March 13th to assist with discharge planning needs and services, as well as to perform the initial assessment. Allen Gomez, BSW, MSW, LCSW  Licensed Restaurant manager, fast foodClinical Social Worker  [facility] Care Management [facility]  Mailing [place], [place], [place] [address] Physical Address-[address], [place], [place] [address] Toll Free Main # [phone] Fax # [phone] Cell # [phone]  Office # [phone] [email or web address]

## 2016-12-11 ENCOUNTER — Other Ambulatory Visit: Payer: Self-pay | Admitting: Licensed Clinical Social Worker

## 2016-12-11 ENCOUNTER — Ambulatory Visit: Payer: Self-pay | Admitting: Licensed Clinical Social Worker

## 2016-12-11 DIAGNOSIS — I10 Essential (primary) hypertension: Secondary | ICD-10-CM | POA: Diagnosis not present

## 2016-12-11 DIAGNOSIS — E118 Type 2 diabetes mellitus with unspecified complications: Secondary | ICD-10-CM | POA: Diagnosis not present

## 2016-12-11 DIAGNOSIS — S82851D Displaced trimalleolar fracture of right lower leg, subsequent encounter for closed fracture with routine healing: Secondary | ICD-10-CM | POA: Diagnosis not present

## 2016-12-11 NOTE — Patient Outreach (Signed)
[facility]N) Care Management  12/11/2016  Robie RidgeHugo R Fyfe 08/08/1936 [id number]  Assessment-CSW is currently covering for CSW Ryland GroupJoanna Saporito. CSW completed outreach attempt today to patient's mobile number. CSW was unable to reach patient successfully. CSW left a HIPPA compliant voice message encouraging patient to return call once available in order to assist with social work needs and services.   Plan-CSW will update CSW Danford BadJoanna Saporito that second outreach was unsuccessful and await for return call from patient.  Dickie LaBrooke Geovana Gebel, BSW, MSW, LCSW [facility] Management [email or web address] Phone: [phone] Fax: [phone]

## 2016-12-12 ENCOUNTER — Other Ambulatory Visit: Payer: Self-pay | Admitting: *Deleted

## 2016-12-12 ENCOUNTER — Encounter: Payer: Self-pay | Admitting: *Deleted

## 2016-12-12 NOTE — Patient Outreach (Signed)
[facility]) Care Management  12/12/2016  Allen Gomez 11/13/1935 [id number]   CSW was able to make contact with patient today to perform the initial assessment, as well as assess and assist with social work needs and services, when CSW met with patient at [facility] [place] [place] where patient currently resides to receive short-term rehabilitative services.  CSW introduced self, explained role and types of services provided through [facility] ([place] [facility]). CSW further explained to patient that CSW works with patient's Telephonic RNCM, also with [facility], Quinn Plowman. CSW then explained the reason for the visit, indicating that patient's insurance, [employer] thought that patient would benefit from social work services and resources to assist with discharge planning needs and services from the skilled nursing facility.  CSW obtained two HIPAA compliant identifiers from patient, which included patient's name and date of birth. Patient was extremely pleasant and cooperative during the visit with CSW. Patient admitted that he is pleased with the therapies (both physical and occupational) that he is receiving at [facility] and believes he has made great progress.  However, patient indicated that he is anxious to return to his home, hoping to be discharged from [facility] on March 13th.  Patient is agreeable to having CSW work with his discharge planning coordinator at [facility] to arrange home health services, and durable medical equipment, if recommended.  CSW agreed to contact the discharge planning coordinator as soon as the call is terminated, to make the appropriate arrangements, as well as ensure that patient is still targeted for discharge on Tuesday, March 13th.  CSW further agreed to follow-up with patient in one week to assess need for continued social work involvement, as patient reports no specific  social work needs at present. Nat Christen, BSW, MSW, LCSW  Licensed Education officer, environmental Health System  Mailing [place] [address] [place], [place] [address] Physical Address-[address], [place], [place] [address] Toll Free Main # [phone] Fax # [phone] Cell # [phone]  Office # [phone] [email or web address]

## 2016-12-17 ENCOUNTER — Ambulatory Visit: Payer: Self-pay | Admitting: *Deleted

## 2016-12-18 DIAGNOSIS — M25571 Pain in right ankle and joints of right foot: Secondary | ICD-10-CM | POA: Diagnosis not present

## 2016-12-19 ENCOUNTER — Other Ambulatory Visit: Payer: Self-pay | Admitting: *Deleted

## 2016-12-20 DIAGNOSIS — S9304XA Dislocation of right ankle joint, initial encounter: Secondary | ICD-10-CM | POA: Diagnosis not present

## 2016-12-20 DIAGNOSIS — S82891D Other fracture of right lower leg, subsequent encounter for closed fracture with routine healing: Secondary | ICD-10-CM | POA: Diagnosis not present

## 2016-12-20 DIAGNOSIS — W19XXXA Unspecified fall, initial encounter: Secondary | ICD-10-CM | POA: Diagnosis not present

## 2016-12-20 NOTE — Patient Outreach (Signed)
[facility]) Care Management  12/20/2016  Allen Gomez 04/30/1936 [id number]   CSW was able to meet with patient at [facility] [place] where patient was residing to receive short-term rehabilitative services, on Thursday, March 15th to assess and assist with discharge planning needs and services.  Patient's discharge was postponed from Tuesday, March 12th to Friday, March 16th, as patient realized that he would not be able to return home to live independently, unable to maneuver stairs or perform activities of daily living independently.  [facility] will be [facility] on Friday and transferring to a lower level of care in an assisted living facility.  Patient encouraged CSW to contact Ernestine Mcmurray [phone]), Admissions Coordinator at the assisted living facility, if additional information was required for CSW's records.  Patient admits that all the durable medical equipment he needs has already been ordered and delivered for him to the assisted living facility.  Patient indicated that he has been assigned a Education officer, museum at the assisted living facility who will begin working with him on long-term care arrangements.  Patient did not feel the need for CSW to continue to have involvement with his plan of care; therefore, CSW will proceed with case closure. CSW will perform a case closure on patient, as all goals of treatment have been met from social work standpoint and no additional social work needs have been identified at this time.  CSW will notify patient's Telephonic RNCM with [facility], Quinn Plowman of CSW's plans to close patient's case.  CSW will fax an update to patient's Primary Care Physician, Dr. Yong Channel  to ensure that they are aware of CSW's involvement with patient's plan of care.  CSW will submit a case closure request to Verlon Setting, Care Management Assistant with [facility], in the form of an [employer].  CSW will ensure that Mrs. Comer is aware of Mrs. Green's,Telephonic RNCM with [facility], continued involvement with patient's care. Nat Christen, BSW, MSW, LCSW  Licensed Education officer, environmental Health System  Mailing [place] [address], [place], [place] [address] Physical Address-[address], [place], [place] [address] Toll Free Main # [phone] Fax # [phone] Cell # [phone]  Office # [phone] [email or web address]

## 2016-12-22 DIAGNOSIS — Z7984 Long term (current) use of oral hypoglycemic drugs: Secondary | ICD-10-CM | POA: Diagnosis not present

## 2016-12-22 DIAGNOSIS — Z9181 History of falling: Secondary | ICD-10-CM | POA: Diagnosis not present

## 2016-12-22 DIAGNOSIS — S82851D Displaced trimalleolar fracture of right lower leg, subsequent encounter for closed fracture with routine healing: Secondary | ICD-10-CM | POA: Diagnosis not present

## 2016-12-22 DIAGNOSIS — E119 Type 2 diabetes mellitus without complications: Secondary | ICD-10-CM | POA: Diagnosis not present

## 2016-12-22 DIAGNOSIS — I10 Essential (primary) hypertension: Secondary | ICD-10-CM | POA: Diagnosis not present

## 2016-12-23 ENCOUNTER — Other Ambulatory Visit: Payer: Self-pay

## 2016-12-23 NOTE — Patient Outreach (Signed)
[facility]N) Care Management  12/23/2016  Allen RidgeHugo R Gomez 02/20/1936 [id number]   Per social worker update patient has been discharged [facility] facility .  Telephone call to patient for transition of care follow up.  Unable to reach patient. HIPAA compliant voice message left with call back phone number.   PLAN:  Will attempt 2nd telephone outreach within 1 week.   George InaDavina Alira Fretwell RN,BSN,CCM [facility] Telephonic  [phone]

## 2016-12-25 ENCOUNTER — Ambulatory Visit: Payer: Self-pay | Admitting: *Deleted

## 2016-12-26 ENCOUNTER — Other Ambulatory Visit: Payer: Self-pay

## 2016-12-26 ENCOUNTER — Ambulatory Visit: Payer: Self-pay | Admitting: *Deleted

## 2016-12-26 NOTE — Patient Outreach (Signed)
[facility]N) Care Management  12/26/2016  Allen Gomez 11/10/1935 [id number]   Transition of Care Referral  Referral Date: 12/22/16 [facility] Date of Discharge: 12/24/16 [facility] Insurance: HTA   Outreach attempt # 2 to patient. No answer at present. RN CM left HIPAA compliant voicemail message along with contact info.    Plan: RN CM will make outreach attempt to patient within one business day if no return call from patient.   Antionette Fairyoshanda Kynzlee Hucker, RN,BSN,CCM [facility] Care [facility] Telephonic Care Management Coordinator Direct Phone: [phone] Toll Free: [phone] Fax: [phone]

## 2016-12-27 ENCOUNTER — Other Ambulatory Visit: Payer: Self-pay

## 2016-12-27 NOTE — Patient Outreach (Signed)
[facility]N) Care Management  12/27/2016  Robie RidgeHugo R Tukes 03/30/1936 [id number]   Transition of Care Referral  Referral Date: 12/22/16 [facility] Date of Discharge: 12/24/16 [facility] Insurance: HTA   Outreach attempt #3 to patient. No answer at present.     Plan: RN CM will have assigned RN CM-Davina Green to send unsuccessful outreach letter to patient.    Antionette Fairyoshanda Loan Oguin, RN,BSN,CCM [facility] Care Management Telephonic Care Management Coordinator Direct Phone: [phone] Toll Free: [phone] Fax: [phone]

## 2016-12-30 ENCOUNTER — Other Ambulatory Visit: Payer: Self-pay

## 2016-12-30 ENCOUNTER — Ambulatory Visit: Payer: Self-pay

## 2016-12-30 DIAGNOSIS — E119 Type 2 diabetes mellitus without complications: Secondary | ICD-10-CM | POA: Diagnosis not present

## 2016-12-30 DIAGNOSIS — I10 Essential (primary) hypertension: Secondary | ICD-10-CM | POA: Diagnosis not present

## 2016-12-30 DIAGNOSIS — S82851D Displaced trimalleolar fracture of right lower leg, subsequent encounter for closed fracture with routine healing: Secondary | ICD-10-CM | POA: Diagnosis not present

## 2016-12-30 DIAGNOSIS — Z7984 Long term (current) use of oral hypoglycemic drugs: Secondary | ICD-10-CM | POA: Diagnosis not present

## 2016-12-30 DIAGNOSIS — Z9181 History of falling: Secondary | ICD-10-CM | POA: Diagnosis not present

## 2016-12-30 NOTE — Patient Outreach (Signed)
[facility]N) Care Management  12/30/2016  Allen RidgeHugo R Gomez 04/12/1936 [id number]   Unable to reach patient after 3 telephone attempts. [facility] outreach letter sent to patient to attempt contact.   George InaDavina Ksean [place] RN,BSN,CCM [facility] Telephonic  [phone]

## 2017-01-01 DIAGNOSIS — M25571 Pain in right ankle and joints of right foot: Secondary | ICD-10-CM | POA: Diagnosis not present

## 2017-01-06 ENCOUNTER — Other Ambulatory Visit: Payer: Self-pay

## 2017-01-06 DIAGNOSIS — Z87891 Personal history of nicotine dependence: Secondary | ICD-10-CM | POA: Diagnosis not present

## 2017-01-06 DIAGNOSIS — M7989 Other specified soft tissue disorders: Secondary | ICD-10-CM | POA: Diagnosis not present

## 2017-01-06 DIAGNOSIS — Z7982 Long term (current) use of aspirin: Secondary | ICD-10-CM | POA: Diagnosis not present

## 2017-01-06 DIAGNOSIS — I10 Essential (primary) hypertension: Secondary | ICD-10-CM | POA: Diagnosis not present

## 2017-01-06 DIAGNOSIS — R609 Edema, unspecified: Secondary | ICD-10-CM | POA: Diagnosis not present

## 2017-01-06 DIAGNOSIS — M25571 Pain in right ankle and joints of right foot: Secondary | ICD-10-CM | POA: Diagnosis not present

## 2017-01-06 DIAGNOSIS — E119 Type 2 diabetes mellitus without complications: Secondary | ICD-10-CM | POA: Diagnosis not present

## 2017-01-06 DIAGNOSIS — R600 Localized edema: Secondary | ICD-10-CM | POA: Diagnosis not present

## 2017-01-06 NOTE — Patient Outreach (Signed)
[facility]) Care Management  01/06/2017  Allen Gomez [facility] 1935-10-21 [id number]   No response from patient after 3 calls and letter outreach attempt PLAN; RNCM will refer patient to care management assistant to close due to inability to establish contact. RNCM will notify patients primary MD of closure.   George Ina RN,BSN,CCM [facility] Telephonic  [phone]

## 2017-01-07 DIAGNOSIS — S82851D Displaced trimalleolar fracture of right lower leg, subsequent encounter for closed fracture with routine healing: Secondary | ICD-10-CM | POA: Diagnosis not present

## 2017-01-07 DIAGNOSIS — Z9181 History of falling: Secondary | ICD-10-CM | POA: Diagnosis not present

## 2017-01-07 DIAGNOSIS — I10 Essential (primary) hypertension: Secondary | ICD-10-CM | POA: Diagnosis not present

## 2017-01-07 DIAGNOSIS — Z7984 Long term (current) use of oral hypoglycemic drugs: Secondary | ICD-10-CM | POA: Diagnosis not present

## 2017-01-07 DIAGNOSIS — E119 Type 2 diabetes mellitus without complications: Secondary | ICD-10-CM | POA: Diagnosis not present

## 2017-01-15 DIAGNOSIS — E1165 Type 2 diabetes mellitus with hyperglycemia: Secondary | ICD-10-CM | POA: Diagnosis not present

## 2017-01-15 DIAGNOSIS — E785 Hyperlipidemia, unspecified: Secondary | ICD-10-CM | POA: Diagnosis not present

## 2017-01-15 DIAGNOSIS — I10 Essential (primary) hypertension: Secondary | ICD-10-CM | POA: Diagnosis not present

## 2017-01-15 DIAGNOSIS — E669 Obesity, unspecified: Secondary | ICD-10-CM | POA: Diagnosis not present

## 2017-01-15 DIAGNOSIS — E1129 Type 2 diabetes mellitus with other diabetic kidney complication: Secondary | ICD-10-CM | POA: Diagnosis not present

## 2017-01-15 DIAGNOSIS — R809 Proteinuria, unspecified: Secondary | ICD-10-CM | POA: Diagnosis not present

## 2017-01-16 DIAGNOSIS — Z4789 Encounter for other orthopedic aftercare: Secondary | ICD-10-CM | POA: Diagnosis not present

## 2017-01-20 DIAGNOSIS — W19XXXA Unspecified fall, initial encounter: Secondary | ICD-10-CM | POA: Diagnosis not present

## 2017-01-20 DIAGNOSIS — S9304XA Dislocation of right ankle joint, initial encounter: Secondary | ICD-10-CM | POA: Diagnosis not present

## 2017-01-20 DIAGNOSIS — S82891D Other fracture of right lower leg, subsequent encounter for closed fracture with routine healing: Secondary | ICD-10-CM | POA: Diagnosis not present

## 2017-02-13 DIAGNOSIS — Z4789 Encounter for other orthopedic aftercare: Secondary | ICD-10-CM | POA: Diagnosis not present

## 2017-02-14 DIAGNOSIS — W19XXXA Unspecified fall, initial encounter: Secondary | ICD-10-CM | POA: Diagnosis not present

## 2017-02-14 DIAGNOSIS — S82891D Other fracture of right lower leg, subsequent encounter for closed fracture with routine healing: Secondary | ICD-10-CM | POA: Diagnosis not present

## 2017-02-14 DIAGNOSIS — S9304XA Dislocation of right ankle joint, initial encounter: Secondary | ICD-10-CM | POA: Diagnosis not present

## 2017-02-19 DIAGNOSIS — W19XXXA Unspecified fall, initial encounter: Secondary | ICD-10-CM | POA: Diagnosis not present

## 2017-02-19 DIAGNOSIS — S9304XA Dislocation of right ankle joint, initial encounter: Secondary | ICD-10-CM | POA: Diagnosis not present

## 2017-02-19 DIAGNOSIS — S82891D Other fracture of right lower leg, subsequent encounter for closed fracture with routine healing: Secondary | ICD-10-CM | POA: Diagnosis not present

## 2017-02-24 DIAGNOSIS — M25571 Pain in right ankle and joints of right foot: Secondary | ICD-10-CM | POA: Diagnosis not present

## 2017-02-24 DIAGNOSIS — M79672 Pain in left foot: Secondary | ICD-10-CM | POA: Diagnosis not present

## 2017-03-02 DIAGNOSIS — R350 Frequency of micturition: Secondary | ICD-10-CM | POA: Diagnosis not present

## 2017-03-02 DIAGNOSIS — R339 Retention of urine, unspecified: Secondary | ICD-10-CM | POA: Diagnosis not present

## 2017-03-04 DIAGNOSIS — R350 Frequency of micturition: Secondary | ICD-10-CM | POA: Diagnosis not present

## 2017-03-04 DIAGNOSIS — E119 Type 2 diabetes mellitus without complications: Secondary | ICD-10-CM | POA: Diagnosis not present

## 2017-03-04 DIAGNOSIS — Z9181 History of falling: Secondary | ICD-10-CM | POA: Diagnosis not present

## 2017-03-04 DIAGNOSIS — I10 Essential (primary) hypertension: Secondary | ICD-10-CM | POA: Diagnosis not present

## 2017-03-04 DIAGNOSIS — M25571 Pain in right ankle and joints of right foot: Secondary | ICD-10-CM | POA: Diagnosis not present

## 2017-03-04 DIAGNOSIS — R339 Retention of urine, unspecified: Secondary | ICD-10-CM | POA: Diagnosis not present

## 2017-03-04 DIAGNOSIS — R2689 Other abnormalities of gait and mobility: Secondary | ICD-10-CM | POA: Diagnosis not present

## 2017-03-07 DIAGNOSIS — R2689 Other abnormalities of gait and mobility: Secondary | ICD-10-CM | POA: Diagnosis not present

## 2017-03-07 DIAGNOSIS — Z9181 History of falling: Secondary | ICD-10-CM | POA: Diagnosis not present

## 2017-03-07 DIAGNOSIS — M25571 Pain in right ankle and joints of right foot: Secondary | ICD-10-CM | POA: Diagnosis not present

## 2017-03-07 DIAGNOSIS — I10 Essential (primary) hypertension: Secondary | ICD-10-CM | POA: Diagnosis not present

## 2017-03-07 DIAGNOSIS — E119 Type 2 diabetes mellitus without complications: Secondary | ICD-10-CM | POA: Diagnosis not present

## 2017-03-11 DIAGNOSIS — I10 Essential (primary) hypertension: Secondary | ICD-10-CM | POA: Diagnosis not present

## 2017-03-11 DIAGNOSIS — E119 Type 2 diabetes mellitus without complications: Secondary | ICD-10-CM | POA: Diagnosis not present

## 2017-03-11 DIAGNOSIS — R2689 Other abnormalities of gait and mobility: Secondary | ICD-10-CM | POA: Diagnosis not present

## 2017-03-11 DIAGNOSIS — M25571 Pain in right ankle and joints of right foot: Secondary | ICD-10-CM | POA: Diagnosis not present

## 2017-03-11 DIAGNOSIS — Z9181 History of falling: Secondary | ICD-10-CM | POA: Diagnosis not present

## 2017-03-18 DIAGNOSIS — M25571 Pain in right ankle and joints of right foot: Secondary | ICD-10-CM | POA: Diagnosis not present

## 2017-03-18 DIAGNOSIS — E119 Type 2 diabetes mellitus without complications: Secondary | ICD-10-CM | POA: Diagnosis not present

## 2017-03-18 DIAGNOSIS — Z9181 History of falling: Secondary | ICD-10-CM | POA: Diagnosis not present

## 2017-03-18 DIAGNOSIS — I10 Essential (primary) hypertension: Secondary | ICD-10-CM | POA: Diagnosis not present

## 2017-03-18 DIAGNOSIS — R2689 Other abnormalities of gait and mobility: Secondary | ICD-10-CM | POA: Diagnosis not present

## 2017-03-20 DIAGNOSIS — M25571 Pain in right ankle and joints of right foot: Secondary | ICD-10-CM | POA: Diagnosis not present

## 2017-03-20 DIAGNOSIS — Z9181 History of falling: Secondary | ICD-10-CM | POA: Diagnosis not present

## 2017-03-20 DIAGNOSIS — R2689 Other abnormalities of gait and mobility: Secondary | ICD-10-CM | POA: Diagnosis not present

## 2017-03-20 DIAGNOSIS — I10 Essential (primary) hypertension: Secondary | ICD-10-CM | POA: Diagnosis not present

## 2017-03-20 DIAGNOSIS — E119 Type 2 diabetes mellitus without complications: Secondary | ICD-10-CM | POA: Diagnosis not present

## 2017-03-22 DIAGNOSIS — S9304XA Dislocation of right ankle joint, initial encounter: Secondary | ICD-10-CM | POA: Diagnosis not present

## 2017-03-22 DIAGNOSIS — S82891D Other fracture of right lower leg, subsequent encounter for closed fracture with routine healing: Secondary | ICD-10-CM | POA: Diagnosis not present

## 2017-03-22 DIAGNOSIS — W19XXXA Unspecified fall, initial encounter: Secondary | ICD-10-CM | POA: Diagnosis not present

## 2017-03-25 DIAGNOSIS — E119 Type 2 diabetes mellitus without complications: Secondary | ICD-10-CM | POA: Diagnosis not present

## 2017-03-25 DIAGNOSIS — M25571 Pain in right ankle and joints of right foot: Secondary | ICD-10-CM | POA: Diagnosis not present

## 2017-03-25 DIAGNOSIS — I10 Essential (primary) hypertension: Secondary | ICD-10-CM | POA: Diagnosis not present

## 2017-03-25 DIAGNOSIS — Z9181 History of falling: Secondary | ICD-10-CM | POA: Diagnosis not present

## 2017-03-25 DIAGNOSIS — R2689 Other abnormalities of gait and mobility: Secondary | ICD-10-CM | POA: Diagnosis not present

## 2017-04-01 DIAGNOSIS — R2689 Other abnormalities of gait and mobility: Secondary | ICD-10-CM | POA: Diagnosis not present

## 2017-04-01 DIAGNOSIS — E119 Type 2 diabetes mellitus without complications: Secondary | ICD-10-CM | POA: Diagnosis not present

## 2017-04-01 DIAGNOSIS — Z9181 History of falling: Secondary | ICD-10-CM | POA: Diagnosis not present

## 2017-04-01 DIAGNOSIS — I10 Essential (primary) hypertension: Secondary | ICD-10-CM | POA: Diagnosis not present

## 2017-04-01 DIAGNOSIS — M25571 Pain in right ankle and joints of right foot: Secondary | ICD-10-CM | POA: Diagnosis not present

## 2017-04-15 DIAGNOSIS — S82891D Other fracture of right lower leg, subsequent encounter for closed fracture with routine healing: Secondary | ICD-10-CM | POA: Diagnosis not present

## 2017-04-21 DIAGNOSIS — H40013 Open angle with borderline findings, low risk, bilateral: Secondary | ICD-10-CM | POA: Diagnosis not present

## 2017-04-21 DIAGNOSIS — H2513 Age-related nuclear cataract, bilateral: Secondary | ICD-10-CM | POA: Diagnosis not present

## 2017-04-21 DIAGNOSIS — W19XXXA Unspecified fall, initial encounter: Secondary | ICD-10-CM | POA: Diagnosis not present

## 2017-04-21 DIAGNOSIS — H353132 Nonexudative age-related macular degeneration, bilateral, intermediate dry stage: Secondary | ICD-10-CM | POA: Diagnosis not present

## 2017-04-21 DIAGNOSIS — S9304XA Dislocation of right ankle joint, initial encounter: Secondary | ICD-10-CM | POA: Diagnosis not present

## 2017-04-21 DIAGNOSIS — E119 Type 2 diabetes mellitus without complications: Secondary | ICD-10-CM | POA: Diagnosis not present

## 2017-04-21 DIAGNOSIS — S82891D Other fracture of right lower leg, subsequent encounter for closed fracture with routine healing: Secondary | ICD-10-CM | POA: Diagnosis not present

## 2017-04-29 DIAGNOSIS — I10 Essential (primary) hypertension: Secondary | ICD-10-CM | POA: Diagnosis not present

## 2017-04-29 DIAGNOSIS — E785 Hyperlipidemia, unspecified: Secondary | ICD-10-CM | POA: Diagnosis not present

## 2017-04-29 DIAGNOSIS — E669 Obesity, unspecified: Secondary | ICD-10-CM | POA: Diagnosis not present

## 2017-04-29 DIAGNOSIS — E1129 Type 2 diabetes mellitus with other diabetic kidney complication: Secondary | ICD-10-CM | POA: Diagnosis not present

## 2017-05-02 DIAGNOSIS — M65342 Trigger finger, left ring finger: Secondary | ICD-10-CM | POA: Diagnosis not present

## 2017-05-07 DIAGNOSIS — N401 Enlarged prostate with lower urinary tract symptoms: Secondary | ICD-10-CM | POA: Diagnosis not present

## 2017-05-07 DIAGNOSIS — D72829 Elevated white blood cell count, unspecified: Secondary | ICD-10-CM | POA: Diagnosis not present

## 2017-05-07 DIAGNOSIS — R351 Nocturia: Secondary | ICD-10-CM | POA: Diagnosis not present

## 2017-05-07 DIAGNOSIS — R350 Frequency of micturition: Secondary | ICD-10-CM | POA: Diagnosis not present

## 2017-05-22 DIAGNOSIS — W19XXXA Unspecified fall, initial encounter: Secondary | ICD-10-CM | POA: Diagnosis not present

## 2017-05-22 DIAGNOSIS — S9304XA Dislocation of right ankle joint, initial encounter: Secondary | ICD-10-CM | POA: Diagnosis not present

## 2017-05-22 DIAGNOSIS — S82891D Other fracture of right lower leg, subsequent encounter for closed fracture with routine healing: Secondary | ICD-10-CM | POA: Diagnosis not present

## 2017-06-16 DIAGNOSIS — S82891D Other fracture of right lower leg, subsequent encounter for closed fracture with routine healing: Secondary | ICD-10-CM | POA: Diagnosis not present

## 2017-06-16 DIAGNOSIS — R200 Anesthesia of skin: Secondary | ICD-10-CM | POA: Diagnosis not present

## 2017-06-16 DIAGNOSIS — R208 Other disturbances of skin sensation: Secondary | ICD-10-CM | POA: Diagnosis not present

## 2017-06-16 DIAGNOSIS — M25571 Pain in right ankle and joints of right foot: Secondary | ICD-10-CM | POA: Diagnosis not present

## 2017-06-16 DIAGNOSIS — E119 Type 2 diabetes mellitus without complications: Secondary | ICD-10-CM | POA: Diagnosis not present

## 2017-06-18 DIAGNOSIS — R350 Frequency of micturition: Secondary | ICD-10-CM | POA: Diagnosis not present

## 2017-06-22 DIAGNOSIS — S82891D Other fracture of right lower leg, subsequent encounter for closed fracture with routine healing: Secondary | ICD-10-CM | POA: Diagnosis not present

## 2017-06-22 DIAGNOSIS — S9304XA Dislocation of right ankle joint, initial encounter: Secondary | ICD-10-CM | POA: Diagnosis not present

## 2017-06-22 DIAGNOSIS — W19XXXA Unspecified fall, initial encounter: Secondary | ICD-10-CM | POA: Diagnosis not present

## 2017-07-11 DIAGNOSIS — M25571 Pain in right ankle and joints of right foot: Secondary | ICD-10-CM | POA: Diagnosis not present

## 2017-07-22 DIAGNOSIS — W19XXXA Unspecified fall, initial encounter: Secondary | ICD-10-CM | POA: Diagnosis not present

## 2017-07-22 DIAGNOSIS — S82891D Other fracture of right lower leg, subsequent encounter for closed fracture with routine healing: Secondary | ICD-10-CM | POA: Diagnosis not present

## 2017-07-22 DIAGNOSIS — S9304XA Dislocation of right ankle joint, initial encounter: Secondary | ICD-10-CM | POA: Diagnosis not present

## 2017-07-30 DIAGNOSIS — I10 Essential (primary) hypertension: Secondary | ICD-10-CM | POA: Diagnosis not present

## 2017-07-30 DIAGNOSIS — E669 Obesity, unspecified: Secondary | ICD-10-CM | POA: Diagnosis not present

## 2017-07-30 DIAGNOSIS — Z23 Encounter for immunization: Secondary | ICD-10-CM | POA: Diagnosis not present

## 2017-07-30 DIAGNOSIS — E785 Hyperlipidemia, unspecified: Secondary | ICD-10-CM | POA: Diagnosis not present

## 2017-07-30 DIAGNOSIS — E1165 Type 2 diabetes mellitus with hyperglycemia: Secondary | ICD-10-CM | POA: Diagnosis not present

## 2017-08-07 DIAGNOSIS — E1165 Type 2 diabetes mellitus with hyperglycemia: Secondary | ICD-10-CM | POA: Diagnosis not present

## 2017-08-19 DIAGNOSIS — M25571 Pain in right ankle and joints of right foot: Secondary | ICD-10-CM | POA: Diagnosis not present

## 2017-08-22 DIAGNOSIS — S9304XA Dislocation of right ankle joint, initial encounter: Secondary | ICD-10-CM | POA: Diagnosis not present

## 2017-08-22 DIAGNOSIS — W19XXXA Unspecified fall, initial encounter: Secondary | ICD-10-CM | POA: Diagnosis not present

## 2017-08-22 DIAGNOSIS — S82891D Other fracture of right lower leg, subsequent encounter for closed fracture with routine healing: Secondary | ICD-10-CM | POA: Diagnosis not present

## 2017-08-25 DIAGNOSIS — R350 Frequency of micturition: Secondary | ICD-10-CM | POA: Diagnosis not present

## 2017-09-21 DIAGNOSIS — W19XXXA Unspecified fall, initial encounter: Secondary | ICD-10-CM | POA: Diagnosis not present

## 2017-09-21 DIAGNOSIS — S82891D Other fracture of right lower leg, subsequent encounter for closed fracture with routine healing: Secondary | ICD-10-CM | POA: Diagnosis not present

## 2017-09-21 DIAGNOSIS — S9304XA Dislocation of right ankle joint, initial encounter: Secondary | ICD-10-CM | POA: Diagnosis not present

## 2019-10-03 ENCOUNTER — Encounter (HOSPITAL_BASED_OUTPATIENT_CLINIC_OR_DEPARTMENT_OTHER): Payer: Self-pay | Admitting: *Deleted

## 2019-10-03 ENCOUNTER — Emergency Department (HOSPITAL_BASED_OUTPATIENT_CLINIC_OR_DEPARTMENT_OTHER)
Admission: EM | Admit: 2019-10-03 | Discharge: 2019-10-03 | Disposition: A | Discharge status: Home / Self Care | Payer: Medicare HMO | Attending: Emergency Medicine | Admitting: Emergency Medicine

## 2019-10-03 ENCOUNTER — Other Ambulatory Visit: Payer: Self-pay

## 2019-10-03 ENCOUNTER — Emergency Department (HOSPITAL_BASED_OUTPATIENT_CLINIC_OR_DEPARTMENT_OTHER): Payer: Medicare HMO

## 2019-10-03 DIAGNOSIS — R071 Chest pain on breathing: Secondary | ICD-10-CM | POA: Insufficient documentation

## 2019-10-03 DIAGNOSIS — Z87891 Personal history of nicotine dependence: Secondary | ICD-10-CM | POA: Diagnosis not present

## 2019-10-03 DIAGNOSIS — S299XXA Unspecified injury of thorax, initial encounter: Secondary | ICD-10-CM | POA: Diagnosis present

## 2019-10-03 DIAGNOSIS — W01198A Fall on same level from slipping, tripping and stumbling with subsequent striking against other object, initial encounter: Secondary | ICD-10-CM | POA: Diagnosis not present

## 2019-10-03 DIAGNOSIS — Y9389 Activity, other specified: Secondary | ICD-10-CM | POA: Diagnosis not present

## 2019-10-03 DIAGNOSIS — Y998 Other external cause status: Secondary | ICD-10-CM | POA: Diagnosis not present

## 2019-10-03 DIAGNOSIS — I10 Essential (primary) hypertension: Secondary | ICD-10-CM | POA: Diagnosis not present

## 2019-10-03 DIAGNOSIS — E119 Type 2 diabetes mellitus without complications: Secondary | ICD-10-CM | POA: Diagnosis not present

## 2019-10-03 DIAGNOSIS — Y92017 Garden or yard in single-family (private) house as the place of occurrence of the external cause: Secondary | ICD-10-CM | POA: Insufficient documentation

## 2019-10-03 DIAGNOSIS — S20211A Contusion of right front wall of thorax, initial encounter: Secondary | ICD-10-CM

## 2019-10-03 MED ORDER — LIDOCAINE 5 % EX OINT: 1.0000 "application " | TOPICAL_OINTMENT | Freq: Three times a day (TID) | CUTANEOUS | 0 refills | Status: AC | PRN

## 2019-10-03 NOTE — Discharge Instructions (Signed)
You were seen in the emergency department today with chest wall injury.  Your x-ray did not show a rib fracture but it is possible you do have a small crack in the ribs that is not picked up on x-ray.  Please use the incentive spirometer at least once per hour while you are awake.  Use the lidocaine ointment as needed for pain.  If you develop fever or shortness of breath he should return to the emergency department.  Please follow-up with your primary care physician in the next 2 weeks.

## 2019-10-03 NOTE — ED Provider Notes (Signed)
Emergency Department Provider Note   I have reviewed the triage vital signs and the nursing notes.   HISTORY  Chief Complaint Fall   HPI Allen Gomez is a 83 y.o. male with PMH of DM, HTN, and HLD presents to the emergency department 3 days after mechanical fall at home.  Patient states that he was picking up trash after his trashcan blew over in his yard.  He was bending over and fell down after tripping on some concrete.  The driveway struck him in the right chest.  He did strike his face but denies serious head injury or trauma.  He had a little bit of a bloody lip but did not lose consciousness.  He has had persistent pain underneath the right breast and right lateral chest.  It is worse with movement and with deep breathing.  He does not feel particularly short of breath and is not experiencing fevers.  No radiation of pain symptoms or other modifying factors.  He does have history of rib fracture and states it feels similar.  He has been trying Tylenol up pain with only mild relief in symptoms.   Past Medical History:  Diagnosis Date  . Allergic rhinitis   . BPH (benign prostatic hypertrophy)   . Diabetes mellitus   . Hyperlipidemia   . Hypertension     Patient Active Problem List   Diagnosis Date Noted  . Annual physical exam 08/06/2013  . Family history of pancreatic cancer 10/06/2012  . Foot pain 06/26/2011  . Abdominal pain, other specified site 06/19/2011  . INGUINAL PAIN, RIGHT 02/05/2010  . INTERTRIGO 05/24/2008  . OBESITY 02/06/2008  . ALLERGIC RHINITIS 12/25/2007  . BENIGN PROSTATIC HYPERTROPHY, MILD, HX OF 02/12/2007  . DIABETES MELLITUS, TYPE II 02/11/2007  . HYPERLIPIDEMIA 02/11/2007  . HYPERTENSION 02/11/2007    Past Surgical History:  Procedure Laterality Date  . NO PAST SURGERIES    . TIBIA FRACTURE SURGERY      Allergies Ace inhibitors, Atorvastatin, Glipizide, and Metformin  Family History  Problem Relation Age of Onset  . Stroke Mother 23   . Pancreatic cancer Sister 21       maternal 1/2 sister  . Pancreatic cancer Maternal Aunt        dx and died in his 28s  . Endometrial cancer Daughter 11  . Heart attack Other        uncle age 57  . Colon cancer Neg Hx   . Prostate cancer Neg Hx   . Diabetes Neg Hx     Social History Social History   Tobacco Use  . Smoking status: Former Research scientist (life sciences)  . Smokeless tobacco: Never Used  . Tobacco comment: quit 1984 (2 ppd x 20 years)  Substance Use Topics  . Alcohol use: Not Currently  . Drug use: No    Review of Systems  Constitutional: No fever/chills Eyes: No visual changes. ENT: No sore throat. Cardiovascular: Positive chest pain. Respiratory: Denies shortness of breath. Gastrointestinal: No abdominal pain.  No nausea, no vomiting.  No diarrhea.  No constipation. Genitourinary: Negative for dysuria. Musculoskeletal: Negative for back pain. Skin: Negative for rash. Neurological: Negative for headaches, focal weakness or numbness.  10-point ROS otherwise negative.  ____________________________________________   PHYSICAL EXAM:  VITAL SIGNS: ED Triage Vitals  Enc Vitals Group     BP 10/03/19 1520 (!) 167/68     Pulse Rate 10/03/19 1520 92     Resp 10/03/19 1520 18     Temp  10/03/19 1520 98.9 F (37.2 C)     Temp Source 10/03/19 1520 Oral     SpO2 10/03/19 1520 97 %     Weight 10/03/19 1521 215 lb (97.5 kg)     Height 10/03/19 1521 5\' 6"  (1.676 m)   Constitutional: Alert and oriented. Well appearing and in no acute distress. Eyes: Conjunctivae are normal.  Head: Atraumatic. Nose: No congestion/rhinnorhea. Mouth/Throat: Mucous membranes are moist.   Neck: No stridor.   Cardiovascular: Normal rate, regular rhythm. Good peripheral circulation. Grossly normal heart sounds.   Respiratory: Normal respiratory effort.  No retractions. Lungs CTAB. Gastrointestinal: Soft and nontender. No distention.  Musculoskeletal: No gross deformities of extremities.  Tenderness  to palpation below the right breast.  No ecchymosis, crepitus, focal tenderness over the right lateral chest or right flank.  Neurologic:  Normal speech and language. Skin:  Skin is warm, dry and intact. No rash noted.  ____________________________________________  RADIOLOGY  DG Ribs Unilateral W/Chest Right  Result Date: 10/03/2019 CLINICAL DATA:  Right anterior rib pain after a fall. EXAM: RIGHT RIBS AND CHEST - 3+ VIEW COMPARISON:  None. FINDINGS: The lungs are clear without focal pneumonia, edema, pneumothorax or pleural effusion. The cardiopericardial silhouette is within normal limits for size. Oblique views of the right ribs were obtained. Radio-opaque marker has been placed on the skin at the site of patient concern. No evidence for an acute displaced right-sided rib fracture. IMPRESSION: 1. No acute cardiopulmonary findings. 2. No evidence for an acute displaced right-sided rib fracture. Electronically Signed   By: 10/05/2019 M.D.   On: 10/03/2019 15:54    ____________________________________________   PROCEDURES  Procedure(s) performed:   Procedures  None  ____________________________________________   INITIAL IMPRESSION / ASSESSMENT AND PLAN / ED COURSE  Pertinent labs & imaging results that were available during my care of the patient were reviewed by me and considered in my medical decision making (see chart for details).   Patient presents to the emergency department for evaluation of chest wall pain after fall.  The pain is very reproducible on my exam and I suspect it is related to MSK etiology.  My suspicion for ACS is exceedingly low.  Patient is not having symptoms or vital sign abnormalities to make me think PE.  He has no bruising or ecchymosis.  No abdominal tenderness to suspect liver injury.  Plain film of the right chest with views of the ribs were obtained and no displaced rib fracture was seen.  I am treating the patient with incentive spirometer and  lidocaine ointment.  Plans for PCP follow-up in the next 2 weeks.  Discussed ED return precautions and signs/symptoms of developing pneumonia.    ____________________________________________  FINAL CLINICAL IMPRESSION(S) / ED DIAGNOSES  Final diagnoses:  Contusion of right chest wall, initial encounter    NEW OUTPATIENT MEDICATIONS STARTED DURING THIS VISIT:  New Prescriptions   LIDOCAINE (XYLOCAINE) 5 % OINTMENT    Apply 1 application topically 3 (three) times daily as needed.    Note:  This document was prepared using Dragon voice recognition software and may include unintentional dictation errors.  10/05/2019, MD, [facility]    Mckinleigh Schuchart, [facility], MD 10/03/19 [phone]

## 2019-10-03 NOTE — ED Triage Notes (Signed)
Pt reports he tripped and fell yesterday. Landed on right ribs and hit his face. Denies LOC. Ambulated to triage with steady gait. C/o pain in right ribs with coughing

## 2020-02-19 IMAGING — CR DG RIBS W/ CHEST 3+V*R*
3 series · 3 of 3 positions shown · non-contrast
Comparison: None.

CLINICAL DATA: Right anterior rib pain after a fall.

EXAM:
RIGHT RIBS AND CHEST - 3+ VIEW

[w chest pa]
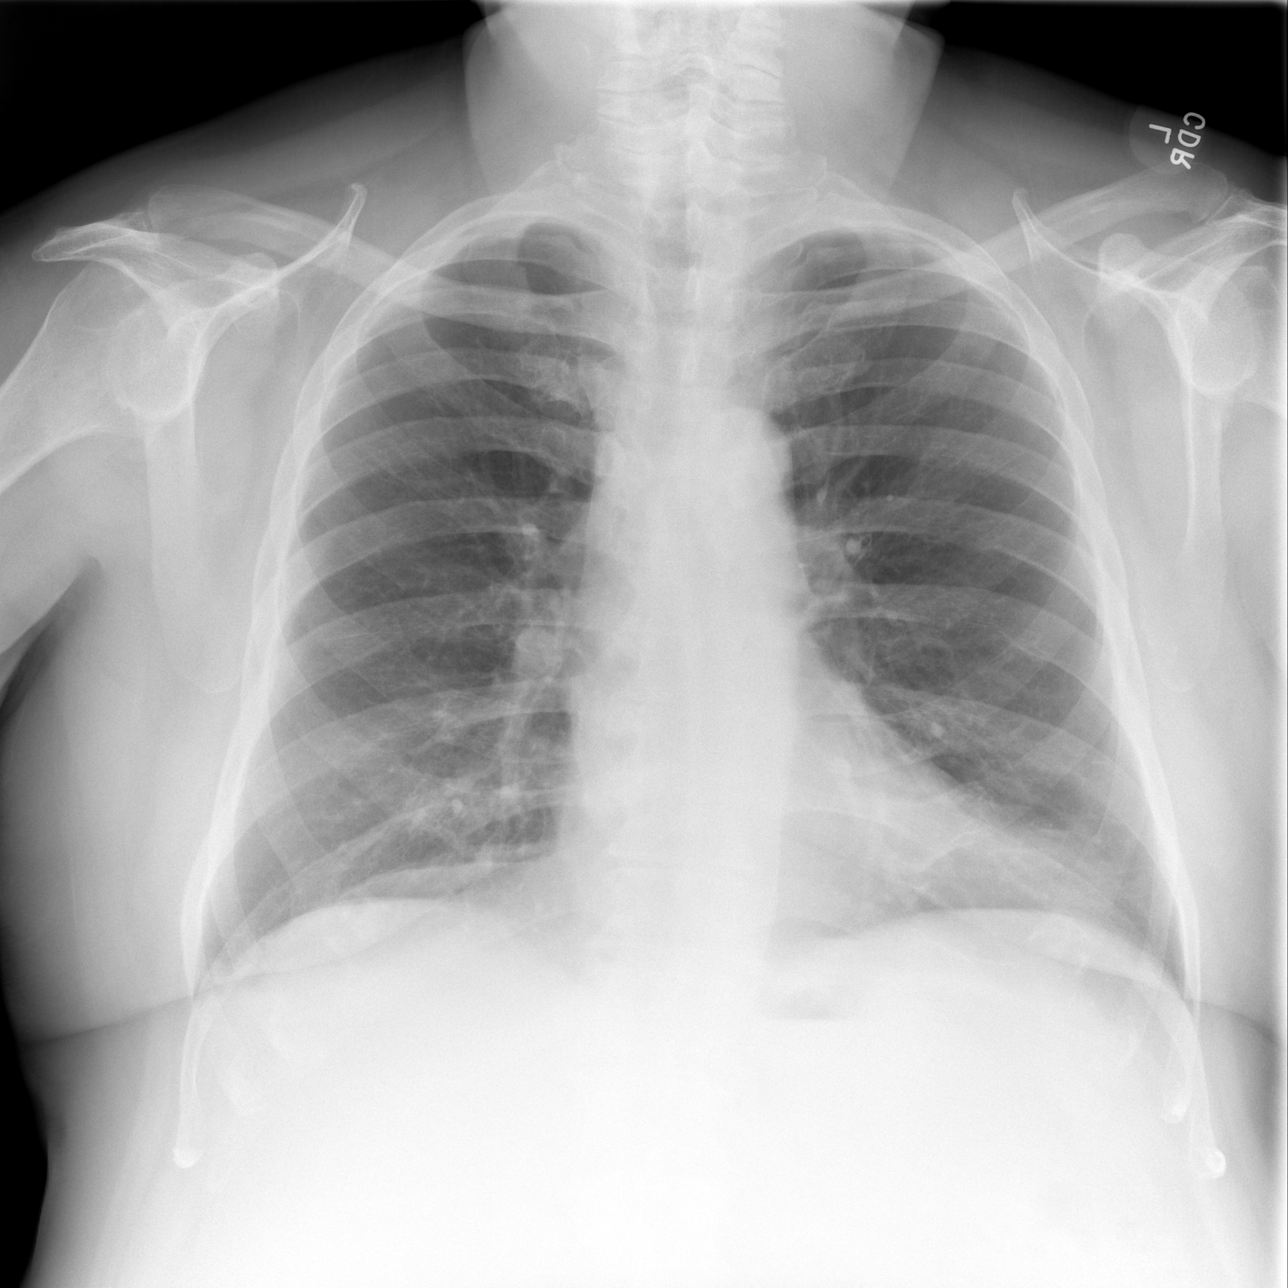

[w ribs ap/pa upper right]
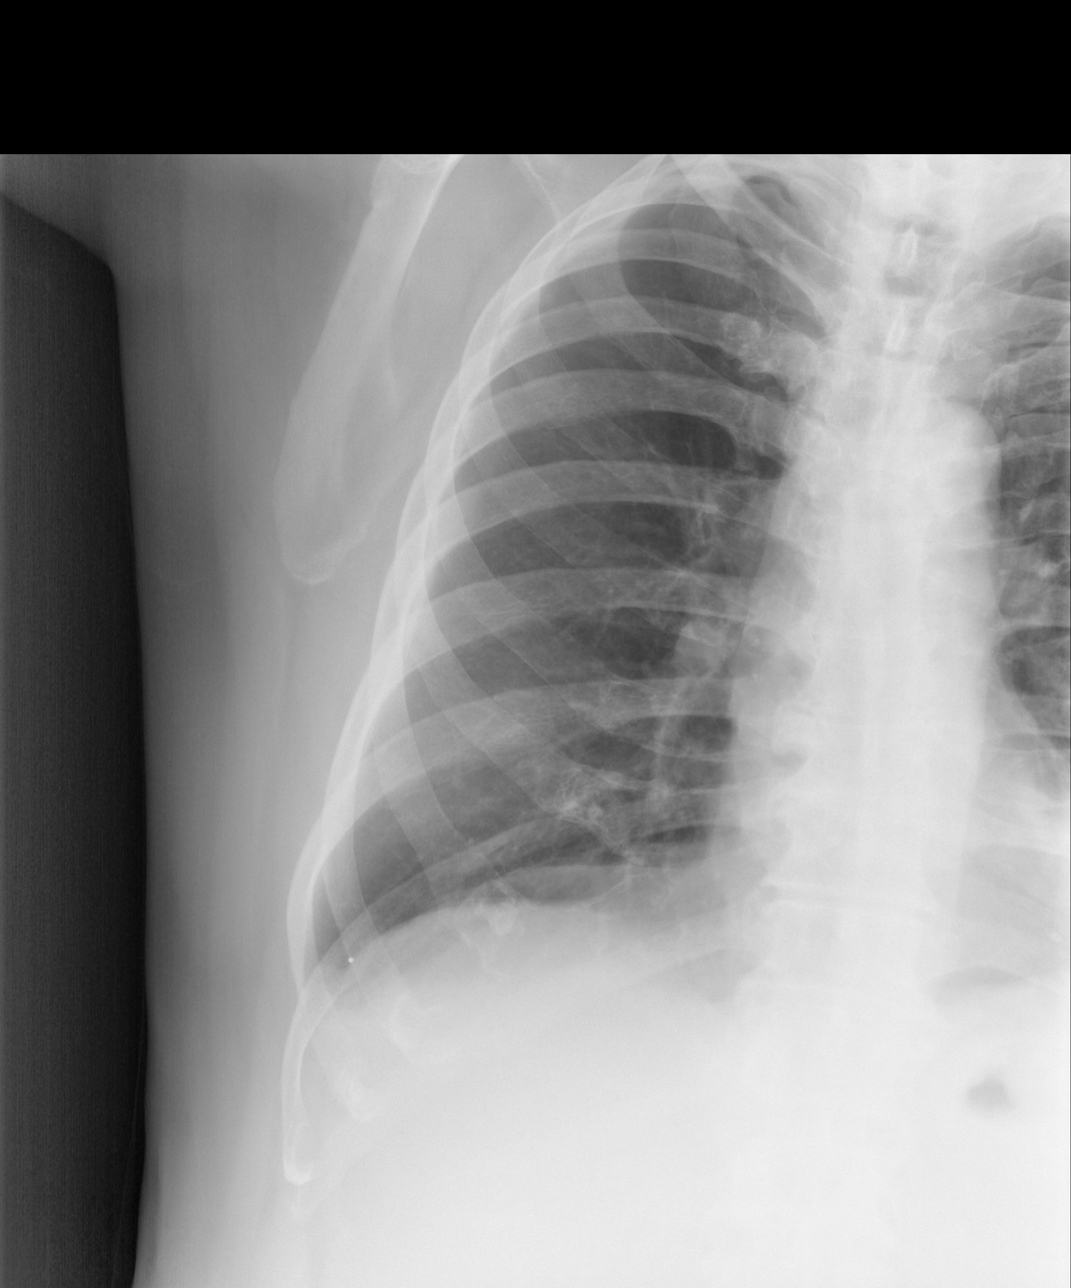

[w ribs oblique right]
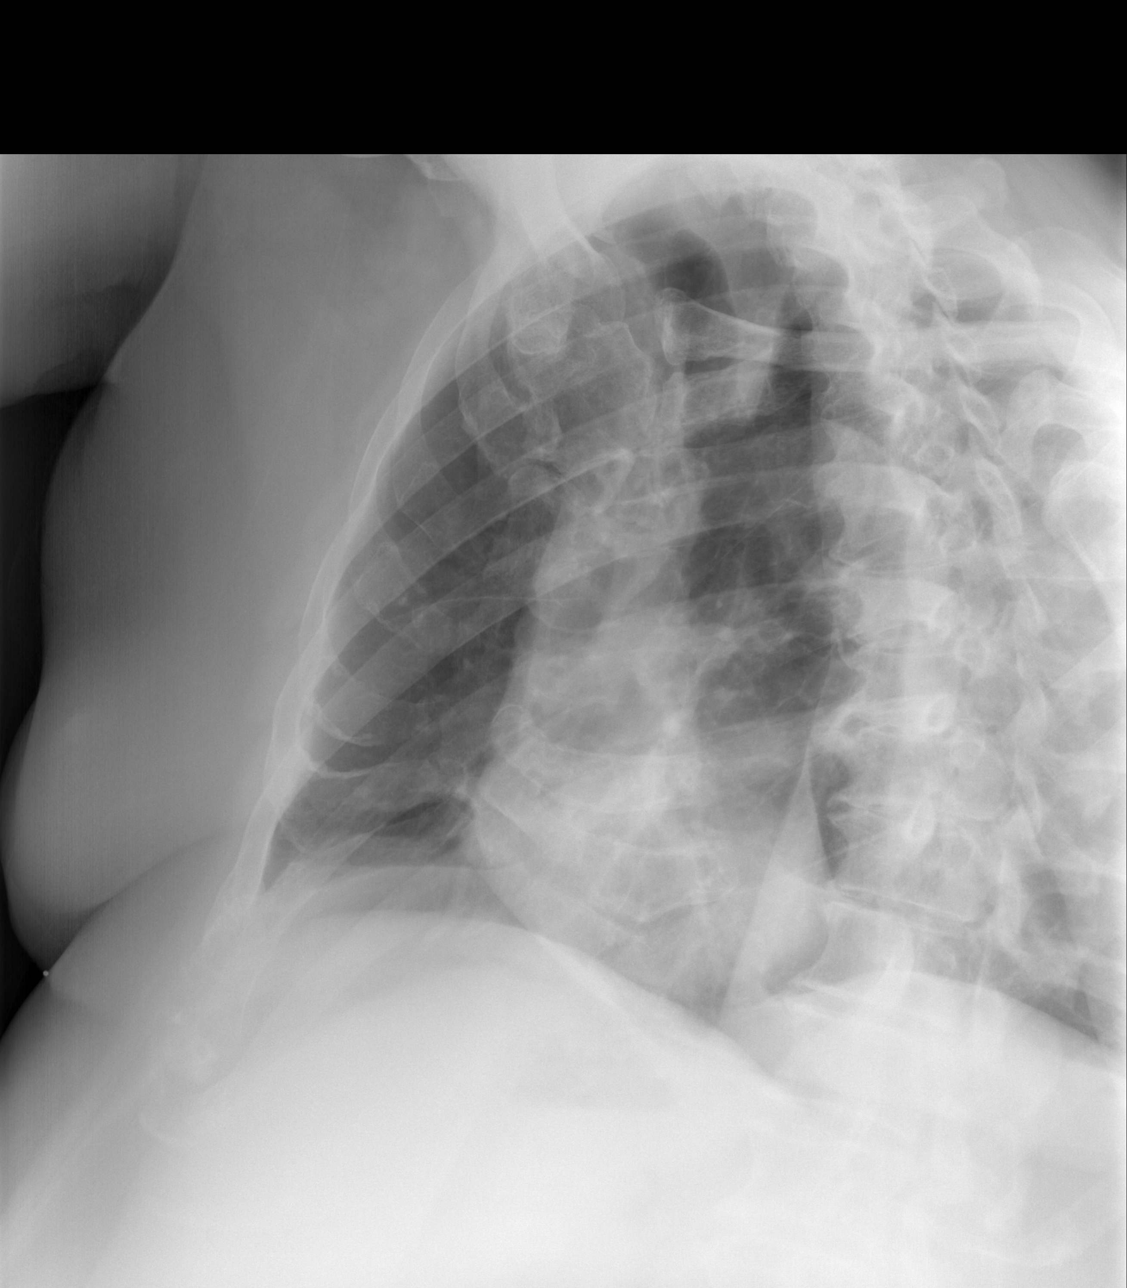

[3 of 3 positions shown; findings below may reference images not displayed]

FINDINGS: The lungs are clear without focal pneumonia, edema, pneumothorax or
pleural effusion. The cardiopericardial silhouette is within normal
limits for size. Oblique views of the right ribs were obtained.
Radio-opaque marker has been placed on the skin at the site of
patient concern. No evidence for an acute displaced right-sided rib
fracture.
IMPRESSION: 1. No acute cardiopulmonary findings.
2. No evidence for an acute displaced right-sided rib fracture.
